# Patient Record
Sex: Female | Born: 1955
Health system: Southern US, Community
[De-identification: ages and names within clinical notes are randomized; demographics above are authoritative.]

## PROBLEM LIST (undated history)

## (undated) DIAGNOSIS — T7840XA Allergy, unspecified, initial encounter: Secondary | ICD-10-CM

## (undated) DIAGNOSIS — E785 Hyperlipidemia, unspecified: Secondary | ICD-10-CM

## (undated) DIAGNOSIS — D649 Anemia, unspecified: Secondary | ICD-10-CM

## (undated) DIAGNOSIS — Z8669 Personal history of other diseases of the nervous system and sense organs: Secondary | ICD-10-CM

## (undated) DIAGNOSIS — N6019 Diffuse cystic mastopathy of unspecified breast: Secondary | ICD-10-CM

## (undated) DIAGNOSIS — E079 Disorder of thyroid, unspecified: Secondary | ICD-10-CM

## (undated) HISTORY — DX: Personal history of other diseases of the nervous system and sense organs: Z86.69

## (undated) HISTORY — DX: Allergy, unspecified, initial encounter: T78.40XA

## (undated) HISTORY — PX: OTHER SURGICAL HISTORY: SHX169

## (undated) HISTORY — DX: Anemia, unspecified: D64.9

## (undated) HISTORY — DX: Hyperlipidemia, unspecified: E78.5

## (undated) HISTORY — DX: Diffuse cystic mastopathy of unspecified breast: N60.19

## (undated) HISTORY — DX: Disorder of thyroid, unspecified: E07.9

---
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
## 2000-08-04 ENCOUNTER — Other Ambulatory Visit: Admission: RE | Admit: 2000-08-04 | Discharge: 2000-08-04 | Payer: Self-pay | Admitting: *Deleted

## 2005-05-05 ENCOUNTER — Ambulatory Visit: Payer: Self-pay | Admitting: Unknown Physician Specialty

## 2005-05-17 ENCOUNTER — Ambulatory Visit: Payer: Self-pay | Admitting: Unknown Physician Specialty

## 2006-05-18 ENCOUNTER — Ambulatory Visit: Payer: Self-pay | Admitting: Unknown Physician Specialty

## 2006-07-26 ENCOUNTER — Ambulatory Visit: Payer: Self-pay | Admitting: Unknown Physician Specialty

## 2006-08-30 HISTORY — PX: HERNIA REPAIR: SHX51

## 2006-08-30 HISTORY — PX: FRONTALIS SUSPENSION: SHX1688

## 2007-05-23 ENCOUNTER — Ambulatory Visit: Payer: Self-pay | Admitting: Unknown Physician Specialty

## 2007-08-31 HISTORY — PX: BREAST CYST ASPIRATION: SHX578

## 2007-10-04 ENCOUNTER — Ambulatory Visit: Payer: Self-pay | Admitting: Unknown Physician Specialty

## 2008-05-23 ENCOUNTER — Ambulatory Visit: Payer: Self-pay | Admitting: Unknown Physician Specialty

## 2009-05-27 ENCOUNTER — Ambulatory Visit: Payer: Self-pay | Admitting: Unknown Physician Specialty

## 2010-06-19 ENCOUNTER — Ambulatory Visit: Payer: Self-pay | Admitting: Unknown Physician Specialty

## 2011-05-11 LAB — CBC AND DIFFERENTIAL
HCT: 41 % (ref 36–46)
Neutrophils Absolute: 2 /uL
Platelets: 190 10*3/uL (ref 150–399)

## 2011-05-11 LAB — TSH: TSH: 3.06 u[IU]/mL (ref <=5.90)

## 2011-05-11 LAB — LIPID PANEL: LDL Cholesterol: 68 mg/dL

## 2011-07-07 ENCOUNTER — Ambulatory Visit: Payer: Self-pay | Admitting: Unknown Physician Specialty

## 2011-09-02 ENCOUNTER — Encounter: Payer: Self-pay | Admitting: Internal Medicine

## 2011-09-02 ENCOUNTER — Ambulatory Visit (INDEPENDENT_AMBULATORY_CARE_PROVIDER_SITE_OTHER): Payer: PRIVATE HEALTH INSURANCE | Admitting: Internal Medicine

## 2011-09-02 DIAGNOSIS — E039 Hypothyroidism, unspecified: Secondary | ICD-10-CM

## 2011-09-02 DIAGNOSIS — E079 Disorder of thyroid, unspecified: Secondary | ICD-10-CM

## 2011-09-02 DIAGNOSIS — E559 Vitamin D deficiency, unspecified: Secondary | ICD-10-CM

## 2011-09-02 DIAGNOSIS — Z124 Encounter for screening for malignant neoplasm of cervix: Secondary | ICD-10-CM

## 2011-09-02 DIAGNOSIS — Z1211 Encounter for screening for malignant neoplasm of colon: Secondary | ICD-10-CM

## 2011-09-02 DIAGNOSIS — Z8669 Personal history of other diseases of the nervous system and sense organs: Secondary | ICD-10-CM | POA: Insufficient documentation

## 2011-09-02 DIAGNOSIS — Z79899 Other long term (current) drug therapy: Secondary | ICD-10-CM

## 2011-09-02 DIAGNOSIS — J45909 Unspecified asthma, uncomplicated: Secondary | ICD-10-CM

## 2011-09-02 DIAGNOSIS — G43909 Migraine, unspecified, not intractable, without status migrainosus: Secondary | ICD-10-CM

## 2011-09-02 DIAGNOSIS — J4540 Moderate persistent asthma, uncomplicated: Secondary | ICD-10-CM | POA: Insufficient documentation

## 2011-09-02 DIAGNOSIS — J309 Allergic rhinitis, unspecified: Secondary | ICD-10-CM

## 2011-09-02 MED ORDER — NEBIVOLOL HCL 2.5 MG PO TABS: 2.5000 mg | ORAL_TABLET | Freq: Every day | ORAL | Status: DC

## 2011-09-02 NOTE — Assessment & Plan Note (Addendum)
Triggered by  hormone fluctuations.  Occur every 2 weeks.  Started in late 30's.  Initially treated by headache clinic. Doesn't want to try depakot or topomax

## 2011-09-02 NOTE — Assessment & Plan Note (Signed)
Since age 56 but not treated regularly until the last 2 years .Marland Kitchen She is due for repeat TSH in March

## 2011-09-02 NOTE — Progress Notes (Signed)
  Subjective:    Patient ID: Caitlyn Gregory, female    DOB: July 01, 1956, 56 y.o.   MRN: [id number]  HPI  Ms, Caitlyn Gregory is a 56 yr old white femlae with a history of migraine headaches, seasonal rhinitis, and remote history of anorexia nervosa who is transferring care from [facility] .      Review of Systems     Objective:   Physical Exam        Assessment & Plan:   Subjective:    Caitlyn Gregory  Headache symptoms began about 30 years ago. Generally, the headaches last about 5 days and occur several times per month. The headaches do not seem to be related to any time of the day. The headaches are usually dull and are located in both hemispheres.  The patient rates her most severe headaches a 8 on a scale from 1 to 10. Recently, the headaches have been stable. Work attendance or other daily activities are affected by the headaches. Precipitating factors include: menses. The headaches are usually not preceded by an aura. Associated neurologic symptoms: worsening school/work performance and neck pain. The patient denies loss of balance and vision problems. Home has included excedrin and Imitrex oral with marked improvement. Other history includes: allergic rhinitis. Family history includes migraine headaches in mother.  The following portions of the patient's history were reviewed and updated as appropriate: allergies, current medications, past family history, past medical history, past social history, past surgical history and problem list.  Review of Systems A comprehensive review of systems was negative.    Objective:    BP 126/74  Pulse 68  Temp(Src) 97.7 F (36.5 C) (Oral)  Ht 5' 3.25" (1.607 m)  Wt 120 lb (54.432 kg)  BMI 21.09 kg/m2 General appearance: alert, cooperative and appears stated age Lungs: clear to auscultation bilaterally Heart: regular rate and rhythm, S1, S2 normal, no murmur, click, rub or gallop Abdomen: soft, non-tender; bowel sounds normal; no masses,  no  organomegaly Extremities: extremities normal, atraumatic, no cyanosis or edema Skin: Skin color, texture, turgor normal. No rashes or lesions    Assessment:    Menstrual migraine  with some component of medication rebound due to daily use of excedrin .   Plan:    Lie in darkened room and apply cold packs as needed for pain. Side effect profile discussed in detail. Asked to keep headache diary. Patient reassured that neurodiagnostic workup not indicated from benign H&P.  Trial of Bystolic  2.5 mg daily for prevention

## 2011-09-03 ENCOUNTER — Telehealth: Payer: Self-pay | Admitting: Internal Medicine

## 2011-09-03 NOTE — Telephone Encounter (Signed)
This is a new patient to me who was I believe  Was already  taking imitrex every month for years.  If it is suddenly too expensive, Iit means her insurance has decided to change coverage and she will need to look at her insurance formulary to see which one they cover at a lower Tier.

## 2011-09-03 NOTE — Telephone Encounter (Signed)
Medication for migraine prevention is to expensive could you prescribe something else.

## 2011-09-03 NOTE — Telephone Encounter (Signed)
Patient says that imitrex is too expensive, she is asking if she can get something cheaper.

## 2011-09-06 NOTE — Telephone Encounter (Signed)
Left message asking patient to return my call.

## 2011-09-07 ENCOUNTER — Telehealth: Payer: Self-pay | Admitting: *Deleted

## 2011-09-07 MED ORDER — METOPROLOL SUCCINATE ER 25 MG PO TB24: ORAL_TABLET | ORAL | Status: DC

## 2011-09-07 NOTE — Telephone Encounter (Signed)
Pt has been prescribed toprol for her headaches.

## 2011-09-07 NOTE — Telephone Encounter (Signed)
Pharmacist from [facility] [address] called to report that pt's co pay on bystolic is too expensive and they are asking that you change this to something else.  Please advise.

## 2011-09-07 NOTE — Telephone Encounter (Signed)
Ok,  Toprol XL 25 mg daily at bedtime for headache supression

## 2011-09-07 NOTE — Telephone Encounter (Signed)
Advised pharmacist, toprol called to [facility].

## 2011-09-23 ENCOUNTER — Telehealth: Payer: Self-pay | Admitting: Internal Medicine

## 2011-09-23 NOTE — Telephone Encounter (Signed)
Patient returned you call. 

## 2011-09-23 NOTE — Telephone Encounter (Signed)
Ms. Kinnison was a bit upset about the amount of time it took to change her medication the last time she called (see notes,  She  called on the morning of Friday the 4th, my response at 6 PM did not get taken care of until Tuesday the 8th. Please apologize to her for the delay.  Part of the problem was not enough information (ie, the name of the medication that her insurance would not cover was not referenced in the message so I assumed she was referring to Imitrex, not Bystolic).  Her husband told me today that the metoprolol is not working either because it is making her urinate all night long. Ask her if she is willing to try verapimil, a calcium channel blocker.  Call her on the cell phone  [phone].

## 2011-09-23 NOTE — Telephone Encounter (Signed)
Left message asking patient to return my call.

## 2011-09-27 NOTE — Telephone Encounter (Signed)
Spoke with patient, I did apologize for the phone call taking so long to be returned. Also I spoke with her about the verapimil and she says that she is going to think about it and will call back and let us know if she wants to take it. I will wait to here back from her.

## 2011-11-01 ENCOUNTER — Other Ambulatory Visit (INDEPENDENT_AMBULATORY_CARE_PROVIDER_SITE_OTHER): Payer: PRIVATE HEALTH INSURANCE | Admitting: *Deleted

## 2011-11-01 DIAGNOSIS — Z79899 Other long term (current) drug therapy: Secondary | ICD-10-CM

## 2011-11-01 DIAGNOSIS — E559 Vitamin D deficiency, unspecified: Secondary | ICD-10-CM

## 2011-11-01 DIAGNOSIS — E039 Hypothyroidism, unspecified: Secondary | ICD-10-CM

## 2011-11-01 LAB — COMPREHENSIVE METABOLIC PANEL
BUN: 16 mg/dL (ref 6–23)
CO2: 31 mEq/L (ref 19–32)
Calcium: 9.7 mg/dL (ref 8.4–10.5)
Chloride: 102 mEq/L (ref 96–112)
Creatinine, Ser: 0.7 mg/dL (ref 0.4–1.2)
GFR: 86.6 mL/min (ref >60.00)
Total Bilirubin: 0.3 mg/dL (ref 0.3–1.2)

## 2011-11-01 LAB — TSH: TSH: 1.8 u[IU]/mL (ref 0.35–5.50)

## 2011-11-02 LAB — VITAMIN D 25 HYDROXY (VIT D DEFICIENCY, FRACTURES): Vit D, 25-Hydroxy: 46 ng/mL (ref 30–89)

## 2011-11-03 ENCOUNTER — Encounter: Payer: Self-pay | Admitting: Internal Medicine

## 2011-12-11 ENCOUNTER — Other Ambulatory Visit: Payer: Self-pay | Admitting: Internal Medicine

## 2012-01-20 ENCOUNTER — Other Ambulatory Visit: Payer: Self-pay | Admitting: Internal Medicine

## 2012-01-21 MED ORDER — LEVOTHYROXINE SODIUM 75 MCG PO TABS: 75.0000 ug | ORAL_TABLET | Freq: Every day | ORAL | Status: DC

## 2012-01-21 MED ORDER — SUMATRIPTAN SUCCINATE 100 MG PO TABS: 100.0000 mg | ORAL_TABLET | ORAL | Status: DC | PRN

## 2012-01-25 ENCOUNTER — Other Ambulatory Visit: Payer: Self-pay | Admitting: Internal Medicine

## 2012-03-28 ENCOUNTER — Encounter: Payer: Self-pay | Admitting: Internal Medicine

## 2012-03-28 ENCOUNTER — Ambulatory Visit (INDEPENDENT_AMBULATORY_CARE_PROVIDER_SITE_OTHER): Payer: PRIVATE HEALTH INSURANCE | Admitting: Internal Medicine

## 2012-03-28 VITALS — BP 128/86 | HR 90 | Temp 98.4°F | Resp 16 | Wt 120.0 lb

## 2012-03-28 DIAGNOSIS — R350 Frequency of micturition: Secondary | ICD-10-CM

## 2012-03-28 DIAGNOSIS — G43909 Migraine, unspecified, not intractable, without status migrainosus: Secondary | ICD-10-CM

## 2012-03-28 DIAGNOSIS — R232 Flushing: Secondary | ICD-10-CM

## 2012-03-28 DIAGNOSIS — R002 Palpitations: Secondary | ICD-10-CM

## 2012-03-28 DIAGNOSIS — N951 Menopausal and female climacteric states: Secondary | ICD-10-CM

## 2012-03-28 LAB — POCT URINALYSIS DIPSTICK
Glucose, UA: NEGATIVE
Nitrite, UA: NEGATIVE
Urobilinogen, UA: 0.2

## 2012-03-28 LAB — COMPREHENSIVE METABOLIC PANEL
AST: 23 U/L (ref 0–37)
Alkaline Phosphatase: 51 U/L (ref 39–117)
BUN: 20 mg/dL (ref 6–23)
Calcium: 9.5 mg/dL (ref 8.4–10.5)
Creatinine, Ser: 0.7 mg/dL (ref 0.4–1.2)
Total Bilirubin: 0.7 mg/dL (ref 0.3–1.2)

## 2012-03-28 LAB — TSH: TSH: 4.05 u[IU]/mL (ref 0.35–5.50)

## 2012-03-28 LAB — HEMOGLOBIN A1C: Hgb A1c MFr Bld: 5.2 % (ref 4.6–6.5)

## 2012-03-28 NOTE — Progress Notes (Addendum)
Patient ID: Caitlyn Gregory, female   DOB: 28-Feb-1956, 56 y.o.   MRN: [id number]   Patient Active Problem List  Diagnosis  . Screening for cervical cancer  . Migraine headache  . Hypothyroidism  . Rhinitis, allergic  . Asthma  . Hypovitaminosis D  . Screening for colon cancer  . History of migraine headaches  . Thyroid disease  . Menopausal syndrome    Subjective:  CC:   Chief Complaint  Patient presents with  . Headache    everyday in July  . Hypertension    HPI:   Caitlyn Gregory a 56 y.o. female who presents  Daily headache  Occuring for the last 3 to 4 weeks.,  bp has been elevated  To 144/86,  But has noted improvement in readings with  treatment of migraines with imitrex.  Has cut out caffeine.  exercises daily, but headaches are not brought on with exercise.Marland Kitchen  Anxiety and jittery prominent features .  Having hot flashes now on a daily basis. Had onset of night sweats a few years ago but none until very recently.    Past Medical History  Diagnosis Date  . History of migraine headaches   . Thyroid disease   . Asthma   . Allergy   . Fibrocystic breast disease   . Anemia   . Vitamin d deficiency   . Hyperlipidemia     Past Surgical History  Procedure Date  . Chin implant [id number]  . Appendectomy 1967    secondary to rupture  . Frontalis suspension 2008  . Hernia repair 2008         The following portions of the patient's history were reviewed and updated as appropriate: Allergies, current medications, and problem list.    Review of Systems:   12 Pt  review of systems was negative except those addressed in the HPI,     History   Social History  . Marital Status: Married    Spouse Name: N/A    Number of Children: N/A  . Years of Education: N/A   Occupational History  . Not on file.   Social History Main Topics  . Smoking status: Never Smoker   . Smokeless tobacco: Never Used  . Alcohol Use: No  . Drug Use: No  . Sexually Active: Not on file    Other Topics Concern  . Not on file   Social History Narrative  . No narrative on file    Objective:  BP 128/86  Pulse 90  Temp 98.4 F (36.9 C) (Oral)  Resp 16  Wt 120 lb (54.432 kg)  SpO2 97%  General appearance: alert, anxious, cooperative and appears stated age Ears: normal TM's and external ear canals both ears Neck: no adenopathy, no carotid bruit, supple, symmetrical, trachea midline and thyroid not enlarged, symmetric, no tenderness/mass/nodules Back: symmetric, no curvature. ROM normal. No CVA tenderness. Lungs: clear to auscultation bilaterally Heart: regular rate and rhythm, S1, S2 normal, no murmur, click, rub or gallop Abdomen: soft, non-tender; bowel sounds normal; no masses,  no organomegaly Pulses: 2+ and symmetric Skin: Skin color, texture, turgor normal. No rashes or lesions Lymph nodes: Cervical, supraclavicular, and axillary nodes normal.  Assessment and Plan:  Menopausal syndrome Suggested by history.  Checking thyroid, FSH and LH.  Discussed use of SSRI to manage mood disorder issues.  She will consider it. Does not want hormone therapy .  Migraine headache Discussed  reducing her aerobic exercise regimen as she may be exercising too  vigorously /frequently , and starting a prophylactic medication to manage daily headaches and avoid use of triptans daily.  We tried bystolic in the past, followed by metoprolol, which was not tolerated due to excessive fatigue. Will consider cardizem if headaches continue after reducing her exercise schedule.    Updated Medication List Outpatient Encounter Prescriptions as of 03/28/2012  Medication Sig Dispense Refill  . Aspirin-Acetaminophen-Caffeine (EXCEDRIN PO) Take by mouth daily as needed       . Cholecalciferol (VITAMIN D) 2000 UNITS CAPS Take one by mouth daily       . levothyroxine (SYNTHROID, LEVOTHROID) 75 MCG tablet Take 1 tablet (75 mcg total) by mouth daily.  30 tablet  6  . SUMAtriptan (IMITREX) 100 MG  tablet Take 1 tablet (100 mg total) by mouth every 2 (two) hours as needed for migraine. Take one half at onset of headache  10 tablet  6  . DISCONTD: calcipotriene-betamethasone (TACLONEX) ointment Apply topically daily.        Marland Kitchen DISCONTD: FLOVENT HFA 44 MCG/ACT inhaler INHALE 2 PUFFS BY MOUTH AS DIRECTED 1-2 TIMES A DAY  10.6 g  1  . DISCONTD: metoprolol succinate (TOPROL-XL) 25 MG 24 hr tablet Take one at bedtime for headache suppression.  30 tablet  0  . DISCONTD: nebivolol (BYSTOLIC) 2.5 MG tablet Take 1 tablet (2.5 mg total) by mouth daily.  30 tablet  3     Orders Placed This Encounter  Procedures  . Urine Culture  . HM MAMMOGRAPHY  . TSH  . Magnesium  . Comprehensive metabolic panel  . Follicle stimulating hormone  . Luteinizing hormone  . Hemoglobin A1c  . HM PAP SMEAR  . POCT Urinalysis Dipstick  . HM COLONOSCOPY    No Follow-up on file.

## 2012-03-28 NOTE — Patient Instructions (Addendum)
I recommend you reduce your aerobic exercise to 3 days  30 minutes per week.  You can continue your yoga if it helps you relax.   Think about starting an SSRI to help manage your hot flashe and tension (Lexapro  5mg  )  I am rechecking  TSH ,  FH and LH  hgba1c  and urine today

## 2012-03-29 ENCOUNTER — Encounter: Payer: Self-pay | Admitting: Internal Medicine

## 2012-03-29 DIAGNOSIS — N951 Menopausal and female climacteric states: Secondary | ICD-10-CM | POA: Insufficient documentation

## 2012-03-29 NOTE — Assessment & Plan Note (Signed)
Discussed  reducing her aerobic exercise regimen as she may be exercising too vigorously /frequently , and starting a prophylactic medication to manage daily headaches and avoid use of triptans daily.  We tried bystolic in the past, followed by metoprolol, which was not tolerated due to excessive fatigue. Will consider cardizem if headaches continue after reducing her exercise schedule.

## 2012-03-29 NOTE — Assessment & Plan Note (Signed)
Suggested by history.  Checking thyroid, FSH and LH.  Discussed use of SSRI to manage mood disorder issues.  She will consider it. Does not want hormone therapy .

## 2012-03-30 LAB — LUTEINIZING HORMONE: LH: 33.38 m[IU]/mL

## 2012-03-30 LAB — URINE CULTURE: Organism ID, Bacteria: NO GROWTH

## 2012-04-12 ENCOUNTER — Telehealth: Payer: Self-pay | Admitting: Internal Medicine

## 2012-04-12 NOTE — Telephone Encounter (Signed)
Patient called and stated she received your MyChart message and noticed her TSH was 4.05.  She wanted to know if she medication needed to be changed because her previous doctor told her if it got above 4 then it would need to be adjusted.  Please advise.

## 2012-04-12 NOTE — Telephone Encounter (Signed)
I intentionally did not increase her thyroid medication bc 4.05 is still normal,  And increasing her dose could increase her jitteriness and palpitations. If she would like to tyr a higher dose knowing that, I will call in a higher strength and we will need to repeat her TSH in 6 weeks.  Let me know

## 2012-04-12 NOTE — Telephone Encounter (Signed)
Patient notified, she stated she will stay on the same dose for now and at her appt in October she wants to have it checked again.

## 2012-04-12 NOTE — Telephone Encounter (Signed)
Left message asking patient to call back

## 2012-05-31 ENCOUNTER — Encounter: Payer: Self-pay | Admitting: Internal Medicine

## 2012-05-31 ENCOUNTER — Ambulatory Visit (INDEPENDENT_AMBULATORY_CARE_PROVIDER_SITE_OTHER): Payer: PRIVATE HEALTH INSURANCE | Admitting: Internal Medicine

## 2012-05-31 ENCOUNTER — Other Ambulatory Visit (HOSPITAL_COMMUNITY)
Admission: RE | Admit: 2012-05-31 | Discharge: 2012-05-31 | Disposition: A | Discharge status: Home / Self Care | Payer: PRIVATE HEALTH INSURANCE | Source: Ambulatory Visit | Attending: Internal Medicine | Admitting: Internal Medicine

## 2012-05-31 VITALS — BP 120/76 | HR 82 | Temp 98.6°F | Ht 63.5 in | Wt 120.5 lb

## 2012-05-31 DIAGNOSIS — Z0000 Encounter for general adult medical examination without abnormal findings: Secondary | ICD-10-CM

## 2012-05-31 DIAGNOSIS — G43909 Migraine, unspecified, not intractable, without status migrainosus: Secondary | ICD-10-CM

## 2012-05-31 DIAGNOSIS — Z01411 Encounter for gynecological examination (general) (routine) with abnormal findings: Secondary | ICD-10-CM

## 2012-05-31 DIAGNOSIS — Z01419 Encounter for gynecological examination (general) (routine) without abnormal findings: Secondary | ICD-10-CM | POA: Insufficient documentation

## 2012-05-31 DIAGNOSIS — E039 Hypothyroidism, unspecified: Secondary | ICD-10-CM

## 2012-05-31 DIAGNOSIS — Z1151 Encounter for screening for human papillomavirus (HPV): Secondary | ICD-10-CM | POA: Insufficient documentation

## 2012-05-31 DIAGNOSIS — R9389 Abnormal findings on diagnostic imaging of other specified body structures: Secondary | ICD-10-CM

## 2012-05-31 DIAGNOSIS — Z124 Encounter for screening for malignant neoplasm of cervix: Secondary | ICD-10-CM

## 2012-05-31 DIAGNOSIS — Z23 Encounter for immunization: Secondary | ICD-10-CM

## 2012-05-31 DIAGNOSIS — E559 Vitamin D deficiency, unspecified: Secondary | ICD-10-CM

## 2012-05-31 MED ORDER — LEVOTHYROXINE SODIUM 88 MCG PO TABS: 88.0000 ug | ORAL_TABLET | Freq: Every day | ORAL | Status: DC

## 2012-05-31 NOTE — Assessment & Plan Note (Signed)
Repeat level ordered; she is currently taking 2000 units daily.

## 2012-05-31 NOTE — Assessment & Plan Note (Signed)
last TSH was 4.05, and she is attributing her symptoms of fatigue, joint pain and headaches to underactive thyroid.  We will increase Synthroid dose to 88 mcg and repeat TSH in 6 weeks.

## 2012-05-31 NOTE — Progress Notes (Signed)
Patient ID: Caitlyn Gregory, female   DOB: 03/15/1956, 56 y.o.   MRN: [id number]   Subjective:    Caitlyn Gregory is a 56 y.o. female and is here for a comprehensive physical exam. The patient reports increased anxiety, improved headaches, joint pain involving 2 hands on right hand and left great toe, all sites of previous trauma. .   Health Maintenance  Topic Date Due  . Tetanus/tdap  11/02/1975  . Influenza Vaccine  04/30/2012  . Mammogram  07/06/2013  . Pap Smear  05/29/2014  . Colonoscopy  03/29/2017    The following portions of the patient's history were reviewed and updated as appropriate: allergies, current medications, past family history, past medical history, past social history, past surgical history and problem list.  Review of Systems A comprehensive review of systems was negative except for: Musculoskeletal: positive for stiff joints   Objective:    BP 120/76  Pulse 82  Temp 98.6 F (37 C) (Oral)  Ht 5' 3.5" (1.613 m)  Wt 120 lb 8 oz (54.658 kg)  BMI 21.01 kg/m2  SpO2 98% General appearance: alert, cooperative and appears stated age Eyes: conjunctivae/corneas clear. PERRL, EOM's intact. Fundi benign. Throat: lips, mucosa, and tongue normal; teeth and gums normal Neck: no adenopathy, no carotid bruit, no JVD, supple, symmetrical, trachea midline and thyroid not enlarged, symmetric, no tenderness/mass/nodules Back: symmetric, no curvature. ROM normal. No CVA tenderness. Lungs: clear to auscultation bilaterally Breasts: normal appearance, no masses or tenderness Heart: regular rate and rhythm, S1, S2 normal, no murmur, click, rub or gallop Abdomen: soft, non-tender; bowel sounds normal; no masses,  no organomegaly Pelvic: cervix normal in appearance, external genitalia normal, no adnexal masses or tenderness, no cervical motion tenderness, positive findings: friable cervix, uterus normal size, shape, and consistency and vagina normal without discharge Extremities: extremities normal,  atraumatic, no cyanosis or edema Pulses: 2+ and symmetric Skin: Skin color, texture, turgor normal. No rashes or lesions Lymph nodes: Cervical, supraclavicular, and axillary nodes normal. Neurologic: Grossly normal    Assessment:   Hypothyroidism last TSH was 4.05, and she is attributing her symptoms of fatigue, joint pain and headaches to underactive thyroid.  We will increase Synthroid dose to 88 mcg and repeat TSH in 6 weeks.   Migraine headache Improved since stopping her upper body weight lifting.   Screening for cervical cancer PAP was done today.  Cervix was friable.  Last intercourse was 4 days ago.  Will repeat pelvic exam in a month or two after she has postponed coitus for a week prior.    Hypovitaminosis D Repeat level ordered; she is currently taking 2000 units daily.   Updated Medication List Outpatient Encounter Prescriptions as of 05/31/2012  Medication Sig Dispense Refill  . Aspirin-Acetaminophen-Caffeine (EXCEDRIN PO) Take by mouth daily as needed       . Cholecalciferol (VITAMIN D) 2000 UNITS CAPS Take one by mouth daily       . fluticasone (FLOVENT HFA) 44 MCG/ACT inhaler Inhale 1 puff into the lungs 2 (two) times daily.      Marland Kitchen levothyroxine (SYNTHROID, LEVOTHROID) 88 MCG tablet Take 1 tablet (88 mcg total) by mouth daily.  30 tablet  6  . SUMAtriptan (IMITREX) 100 MG tablet Take 1 tablet (100 mg total) by mouth every 2 (two) hours as needed for migraine. Take one half at onset of headache  10 tablet  6  . DISCONTD: levothyroxine (SYNTHROID, LEVOTHROID) 75 MCG tablet Take 1 tablet (75 mcg total) by mouth  daily.  30 tablet  6

## 2012-05-31 NOTE — Patient Instructions (Addendum)
Please return around Thanksgiving for a reapet TSH (after 6 weeks of increased Synthroid dose)  Continue 2000 units of Vit D for  now

## 2012-05-31 NOTE — Assessment & Plan Note (Signed)
PAP was done today.  Cervix was friable.  Last intercourse was 4 days ago.  Will repeat pelvic exam in a month or two after she has postponed coitus for a week prior.

## 2012-05-31 NOTE — Assessment & Plan Note (Signed)
Improved since stopping her upper body weight lifting.

## 2012-06-04 LAB — GENITAL CULTURE

## 2012-06-09 LAB — HM PAP SMEAR: HM Pap smear: NORMAL

## 2012-06-12 ENCOUNTER — Encounter: Payer: Self-pay | Admitting: Internal Medicine

## 2012-06-12 DIAGNOSIS — E039 Hypothyroidism, unspecified: Secondary | ICD-10-CM

## 2012-06-13 MED ORDER — LEVOTHYROXINE SODIUM 75 MCG PO TABS: 75.0000 ug | ORAL_TABLET | Freq: Every day | ORAL | Status: DC

## 2012-07-14 ENCOUNTER — Other Ambulatory Visit: Payer: PRIVATE HEALTH INSURANCE

## 2012-08-07 ENCOUNTER — Encounter: Payer: Self-pay | Admitting: Internal Medicine

## 2012-08-07 DIAGNOSIS — E039 Hypothyroidism, unspecified: Secondary | ICD-10-CM

## 2012-08-07 DIAGNOSIS — Z1239 Encounter for other screening for malignant neoplasm of breast: Secondary | ICD-10-CM

## 2012-08-09 MED ORDER — LEVOTHYROXINE SODIUM 75 MCG PO TABS: 75.0000 ug | ORAL_TABLET | Freq: Every day | ORAL | Status: DC

## 2012-08-15 ENCOUNTER — Other Ambulatory Visit: Payer: Self-pay | Admitting: Internal Medicine

## 2012-08-15 NOTE — Telephone Encounter (Signed)
Refill request for Imitrex 100 mg. Ok to refill?

## 2012-08-15 NOTE — Telephone Encounter (Signed)
Drug Name- Sumatriptan Succ 100 mg tab  Directions- Take 1/2 tablet at onset of headache then take 1 tablet every 2 hours as needed for migrane  Quantity-10

## 2012-08-16 MED ORDER — SUMATRIPTAN SUCCINATE 100 MG PO TABS: 100.0000 mg | ORAL_TABLET | ORAL | Status: DC | PRN

## 2012-09-07 ENCOUNTER — Ambulatory Visit: Payer: Self-pay | Admitting: Internal Medicine

## 2012-09-08 ENCOUNTER — Telehealth: Payer: Self-pay | Admitting: Internal Medicine

## 2012-09-08 NOTE — Telephone Encounter (Signed)
Her mammogram was normal.  Repeat in one year 

## 2012-09-11 NOTE — Telephone Encounter (Signed)
Pt notified of the results.  

## 2012-09-20 ENCOUNTER — Other Ambulatory Visit: Payer: Self-pay | Admitting: Internal Medicine

## 2012-09-20 ENCOUNTER — Other Ambulatory Visit: Payer: Self-pay | Admitting: General Practice

## 2012-09-20 ENCOUNTER — Encounter: Payer: Self-pay | Admitting: Internal Medicine

## 2012-09-20 DIAGNOSIS — E039 Hypothyroidism, unspecified: Secondary | ICD-10-CM

## 2012-09-20 NOTE — Telephone Encounter (Signed)
Med filled.  

## 2013-01-05 ENCOUNTER — Ambulatory Visit (INDEPENDENT_AMBULATORY_CARE_PROVIDER_SITE_OTHER): Payer: PRIVATE HEALTH INSURANCE | Admitting: Internal Medicine

## 2013-01-05 ENCOUNTER — Encounter: Payer: Self-pay | Admitting: Internal Medicine

## 2013-01-05 VITALS — BP 138/88 | HR 80 | Temp 98.0°F | Resp 16 | Wt 120.5 lb

## 2013-01-05 DIAGNOSIS — F4323 Adjustment disorder with mixed anxiety and depressed mood: Secondary | ICD-10-CM

## 2013-01-05 DIAGNOSIS — IMO0001 Reserved for inherently not codable concepts without codable children: Secondary | ICD-10-CM

## 2013-01-05 DIAGNOSIS — J4540 Moderate persistent asthma, uncomplicated: Secondary | ICD-10-CM

## 2013-01-05 DIAGNOSIS — N952 Postmenopausal atrophic vaginitis: Secondary | ICD-10-CM

## 2013-01-05 DIAGNOSIS — Z124 Encounter for screening for malignant neoplasm of cervix: Secondary | ICD-10-CM

## 2013-01-05 DIAGNOSIS — E559 Vitamin D deficiency, unspecified: Secondary | ICD-10-CM

## 2013-01-05 DIAGNOSIS — E039 Hypothyroidism, unspecified: Secondary | ICD-10-CM

## 2013-01-05 DIAGNOSIS — J45909 Unspecified asthma, uncomplicated: Secondary | ICD-10-CM

## 2013-01-05 DIAGNOSIS — N951 Menopausal and female climacteric states: Secondary | ICD-10-CM

## 2013-01-05 LAB — CBC WITH DIFFERENTIAL/PLATELET
Basophils Absolute: 0 10*3/uL (ref 0.0–0.1)
Eosinophils Relative: 3 % (ref 0–5)
HCT: 41.2 % (ref 36.0–46.0)
Lymphocytes Relative: 29 % (ref 12–46)
Lymphs Abs: 1.4 10*3/uL (ref 0.7–4.0)
MCV: 97.6 fL (ref 78.0–100.0)
Monocytes Absolute: 0.5 10*3/uL (ref 0.1–1.0)
Neutro Abs: 2.8 10*3/uL (ref 1.7–7.7)
Platelets: 233 10*3/uL (ref 150–400)
RBC: 4.22 MIL/uL (ref 3.87–5.11)
WBC: 4.8 10*3/uL (ref 4.0–10.5)

## 2013-01-05 NOTE — Progress Notes (Signed)
Patient ID: Caitlyn Gregory, female   DOB: 09-02-1955, 57 y.o.   MRN: [id number]   Patient Active Problem List   Diagnosis Date Noted  . Postmenopausal atrophic vaginitis 01/07/2013  . Menopausal syndrome 03/29/2012  . Screening for cervical cancer 09/02/2011  . Migraine headache 09/02/2011  . Hypothyroidism 09/02/2011  . Rhinitis, allergic 09/02/2011  . Asthma, moderate persistent, well-controlled 09/02/2011  . Hypovitaminosis D 09/02/2011  . Screening for colon cancer 09/02/2011    Subjective:  CC:   Chief Complaint  Patient presents with  . Acute Visit    hormonal issues    HPI:   Caitlyn Gregory a 57 y.o. 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  Marland Kitchen   3) Joint pain:  She feels more stiff and achey every day despite lowering and finally eliminating use of free weights and continuing yoga and exercise.  No one particular joint.  No erythema or warmth.  Out performs the other members in her yoga class despite her age  12) dysmennorrhea:  Cannot drink alcohol bc of migraines,  "I use sex to make me feel young since I'm such a good girl about everything else." but has been having difficulty with vaginal dryness leading to repeated testing for infection. .   Past Medical History  Diagnosis Date  . History of migraine headaches   . Thyroid disease   . Asthma   . Allergy   . Fibrocystic  breast disease   . Anemia   . Vitamin D deficiency   . Hyperlipidemia     Past Surgical History  Procedure Laterality Date  . Chin implant  [id number]  . Appendectomy  1967    secondary to rupture  . Frontalis suspension  2008  . Hernia repair  2008       The following portions of the patient's history were reviewed and updated as appropriate: Allergies, current medications, and problem list.    Review of Systems:   Patient denies headache, fevers, malaise, unintentional weight loss, skin rash, eye pain, sinus congestion and sinus pain, sore throat, dysphagia,  hemoptysis , cough, dyspnea, wheezing, chest pain, palpitations, orthopnea, edema, abdominal pain, nausea, melena, diarrhea, constipation, flank pain, dysuria, hematuria, urinary  Frequency, nocturia, numbness, tingling, seizures,  Focal weakness, Loss of consciousness,  Tremor, insomnia, depression,  and suicidal ideation.     History   Social History  . Marital Status: Married    Spouse Name: N/A    Number of Children: N/A  . Years of Education: N/A   Occupational History  . Not on file.   Social History Main Topics  . Smoking status: Never Smoker   . Smokeless tobacco: Never Used  . Alcohol Use: No  . Drug Use: No  . Sexually Active: Not on file   Other Topics Concern  . Not on file  Social History Narrative  . No narrative on file    Objective:  BP 138/88  Pulse 80  Temp(Src) 98 F (36.7 C) (Oral)  Resp 16  Wt 120 lb 8 oz (54.658 kg)  BMI 21.01 kg/m2  SpO2 98%  General appearance: alert, cooperative and appears stated age Ears: normal TM's and external ear canals both ears Throat: lips, mucosa, and tongue normal; teeth and gums normal Neck: no adenopathy, no carotid bruit, supple, symmetrical, trachea midline and thyroid not enlarged, symmetric, no tenderness/mass/nodules Back: symmetric, no curvature. ROM normal. No CVA tenderness. Lungs: clear to auscultation bilaterally Heart: regular  rate and rhythm, S1, S2 normal, no murmur, click, rub or gallop Abdomen: soft, non-tender; bowel sounds normal; no masses,  no organomegaly Pulses: 2+ and symmetric Skin: Skin color, texture, turgor normal. No rashes or lesions Lymph nodes: Cervical, supraclavicular, and axillary nodes normal.  Assessment and Plan:  Postmenopausal atrophic vaginitis Pelvic exam today revealed bilateral erosions at the introitus consistent with friction burns secondary to atrophic vaginitis.  Had long discussion about vaginal lubricants vs vaginal estrogen.  Patient wants to try nonhormonal treatments first.    Asthma, moderate persistent, well-controlled Discussed with patient the recent article she brought about using long term azithromycin to clear patients of mycoplasma rumored to be the cause of many asthma symptoms .  Referral to pulmonologist Mcquaid as she has not seen a pulmonologist in years.    Hypothyroidism TSH is at goal.  No changes to drug regimen.   Screening for cervical cancer Normal PAP and HPV screen Oct 2013,  Vaginal cultures normal  Hypovitaminosis D Well controlled on current regimen.  no changes today.  Menopausal syndrome 60 minutes spent with patient in total today discussing her dysphoria.  Recommended trial of effexor.  She will think about it.    Updated Medication List Outpatient Encounter Prescriptions as of 01/05/2013  Medication Sig Dispense Refill  . Aspirin-Acetaminophen-Caffeine (EXCEDRIN PO) Take by mouth daily as needed       . Cholecalciferol (VITAMIN D) 2000 UNITS CAPS Take one by mouth daily       . SUMAtriptan (IMITREX) 100 MG tablet Take 1 tablet (100 mg total) by mouth every 2 (two) hours as needed for migraine. Take one half at onset of headache  10 tablet  6  . SYNTHROID 75 MCG tablet TAKE 1 TABLET (75 MCG TOTAL) BY MOUTH DAILY.  30 tablet  6  . fluticasone (FLOVENT HFA) 44 MCG/ACT inhaler Inhale 1 puff into the lungs 2 (two) times daily.       No  facility-administered encounter medications on file as of 01/05/2013.     Orders Placed This Encounter  Procedures  . CBC with Differential  . TSH  . Vitamin D 25 hydroxy  . CK  . Ambulatory referral to Pulmonology    No Follow-up on file.

## 2013-01-05 NOTE — Patient Instructions (Addendum)
We are repeating your thyroid and vitamin D levels today.  If they are normal,  I recommend that you consider resuming effexor for a month trial.  Your vaginal exam is consistent with "friction burns" due to the effects of atrophic vaginitis on your ability to maintain lubrication  OTC lubricants worth tryng include Astroglide and KY personal lubricant (vaginal suppository)  We can always try vaginal estrogen  Referral to Dr Kendrick Fries for your asthma

## 2013-01-06 ENCOUNTER — Encounter: Payer: Self-pay | Admitting: Internal Medicine

## 2013-01-06 LAB — CK: Total CK: 77 U/L (ref 7–177)

## 2013-01-07 DIAGNOSIS — N952 Postmenopausal atrophic vaginitis: Secondary | ICD-10-CM | POA: Insufficient documentation

## 2013-01-07 NOTE — Assessment & Plan Note (Signed)
60 minutes spent with patient in total today discussing her dysphoria.  Recommended trial of effexor.  She will think about it.

## 2013-01-07 NOTE — Assessment & Plan Note (Signed)
Discussed with patient the recent article she brought about using long term azithromycin to clear patients of mycoplasma rumored to be the cause of many asthma symptoms .  Referral to pulmonologist Mcquaid as she has not seen a pulmonologist in years.

## 2013-01-07 NOTE — Assessment & Plan Note (Signed)
Pelvic exam today revealed bilateral erosions at the introitus consistent with friction burns secondary to atrophic vaginitis.  Had long discussion about vaginal lubricants vs vaginal estrogen.  Patient wants to try nonhormonal treatments first.

## 2013-01-07 NOTE — Assessment & Plan Note (Signed)
Normal PAP and HPV screen Oct 2013,  Vaginal cultures normal

## 2013-01-07 NOTE — Assessment & Plan Note (Signed)
TSH is at goal.  No changes to drug regimen.

## 2013-01-07 NOTE — Assessment & Plan Note (Signed)
Well controlled on current regimen.  no changes today.   

## 2013-01-08 ENCOUNTER — Encounter: Payer: Self-pay | Admitting: Emergency Medicine

## 2013-01-23 ENCOUNTER — Encounter: Payer: Self-pay | Admitting: Pulmonary Disease

## 2013-01-23 ENCOUNTER — Ambulatory Visit (INDEPENDENT_AMBULATORY_CARE_PROVIDER_SITE_OTHER)
Admission: RE | Admit: 2013-01-23 | Discharge: 2013-01-23 | Disposition: A | Discharge status: Home / Self Care | Payer: PRIVATE HEALTH INSURANCE | Source: Ambulatory Visit | Attending: Pulmonary Disease | Admitting: Pulmonary Disease

## 2013-01-23 ENCOUNTER — Ambulatory Visit (INDEPENDENT_AMBULATORY_CARE_PROVIDER_SITE_OTHER): Payer: PRIVATE HEALTH INSURANCE | Admitting: Pulmonary Disease

## 2013-01-23 VITALS — BP 122/80 | HR 84 | Temp 98.2°F | Ht 63.0 in | Wt 119.1 lb

## 2013-01-23 DIAGNOSIS — J45909 Unspecified asthma, uncomplicated: Secondary | ICD-10-CM

## 2013-01-23 DIAGNOSIS — R059 Cough, unspecified: Secondary | ICD-10-CM

## 2013-01-23 DIAGNOSIS — J4540 Moderate persistent asthma, uncomplicated: Secondary | ICD-10-CM

## 2013-01-23 DIAGNOSIS — R05 Cough: Secondary | ICD-10-CM | POA: Insufficient documentation

## 2013-01-23 NOTE — Assessment & Plan Note (Signed)
I question this diagnosis. She has never had benefit from inhaled corticosteroids and she does not have intermittent wheezing or shortness of breath which is consistent with asthma. She has no airflow obstruction on pulmonary function testing today so I think we can order a methacholine challenge test to put this issue to rest.  See discussion under cough below.

## 2013-01-23 NOTE — Patient Instructions (Signed)
Start using Nasacort one puff each nostril daily Use chlortrimeton/phenylephrine as needed for sinus congestion Use saline sprays or rinses at least twice a day (at least 30 minutes after Nasacort)  We will send you for a Chest Xray We will send you for a methacholine challenge test at [facility]  We will see you back in one month or sooner if needed

## 2013-01-23 NOTE — Progress Notes (Signed)
Subjective:    Patient ID: Caitlyn Gregory, female    DOB: 1956-01-04, 57 y.o.   MRN: [id number]  HPI  A medial is a 57 year old female who comes to our clinic today for evaluation of ongoing chest congestion and cough for 32 years. She states that she had no respiratory symptoms as a child at age 7 she had "a nasty respiratory infection" and she has had chronic cough and congestion ever since then. Apparently not long after this respiratory infection she was evaluated at [facility] where she underwent a bronchoscopy and was treated with 30 days of a sulfa-containing antibiotic as well as theophylline. She states this made no difference in her symptoms and she was never given a definitive diagnosis. Ever since then she has had daily chest congestion, cough productive of clear to green mucus particularly in the mornings. She also has postnasal drip and sinus congestion. In the past she has taken multiple therapies for the chest congestion most recently Flovent. She states that Flovent gives her a headache so she stopped using it in December. She thinks that maybe her chest congestion has picked up since stopping the medication but not by much. She also states that she has used nasal steroids in the past and these also gave her a headache so she stopped. She continues to have daily sinus congestion which is exacerbated by being around grass, pollen, smokers, and chemicals. Typically after several days of sinus congestion her cough worsen. Sometimes when the weather worsens her cough gets worse. Specifically she says that eventually when the wart and this will lead to congestion. She also states that there is mildew in her house but she does not have resolution of symptoms when she goes away on vacation.  She has experienced several bouts of bronchitis over the years but no more frequently than every 2 years or so. She has never smoked cigarettes.    Past Medical History  Diagnosis Date  .  History of migraine headaches   . Thyroid disease   . Asthma   . Allergy   . Fibrocystic breast disease   . Anemia   . Vitamin D deficiency   . Hyperlipidemia      Family History  Problem Relation Age of Onset  . Stroke Mother 62  . Cancer Mother     unknown tyoe  . Osteoporosis Mother   . Heart disease Father 100    AMI, died at 108 from second MI  . Alcohol abuse Brother      History   Social History  . Marital Status: Married    Spouse Name: N/A    Number of Children: N/A  . Years of Education: N/A   Occupational History  . Not on file.   Social History Main Topics  . Smoking status: Never Smoker   . Smokeless tobacco: Never Used  . Alcohol Use: No  . Drug Use: No  . Sexually Active: Not on file   Other Topics Concern  . Not on file   Social History Narrative  . No narrative on file     Allergies  Allergen Reactions  . Penicillins Rash  . Sulfa Antibiotics Rash     Outpatient Prescriptions Prior to Visit  Medication Sig Dispense Refill  . Aspirin-Acetaminophen-Caffeine (EXCEDRIN PO) Take by mouth daily as needed       . Cholecalciferol (VITAMIN D) 2000 UNITS CAPS Take one by mouth daily       . SUMAtriptan (IMITREX)  100 MG tablet Take 1 tablet (100 mg total) by mouth every 2 (two) hours as needed for migraine. Take one half at onset of headache  10 tablet  6  . SYNTHROID 75 MCG tablet TAKE 1 TABLET (75 MCG TOTAL) BY MOUTH DAILY.  30 tablet  6  . fluticasone (FLOVENT HFA) 44 MCG/ACT inhaler Inhale 1 puff into the lungs 2 (two) times daily.       No facility-administered medications prior to visit.      Review of Systems  Constitutional: Negative for fever, chills and unexpected weight change.  HENT: Positive for congestion. Negative for ear pain, nosebleeds, sore throat, rhinorrhea, sneezing, trouble swallowing, dental problem, voice change, postnasal drip and sinus pressure.   Eyes: Negative for visual disturbance.  Respiratory: Positive for cough  and shortness of breath. Negative for choking.   Cardiovascular: Negative for chest pain and leg swelling.  Gastrointestinal: Negative for vomiting, abdominal pain and diarrhea.  Genitourinary: Negative for difficulty urinating.  Musculoskeletal: Positive for arthralgias.  Skin: Negative for rash.  Neurological: Positive for headaches. Negative for tremors and syncope.  Hematological: Does not bruise/bleed easily.       Objective:   Physical Exam  Filed Vitals:   01/23/13 1117  BP: 122/80  Pulse: 84  Temp: 98.2 F (36.8 C)  TempSrc: Oral  Height: 5\' 3"  (1.6 m)  Weight: 119 lb 1.9 oz (54.032 kg)  SpO2: 97%   RA  Gen: well appearing, no acute distress HEENT: NCAT, PERRL, EOMi, OP clear, neck supple without masses PULM: CTA B CV: RRR, no mgr, no JVD AB: BS+, soft, nontender, no hsm Ext: warm, no edema, no clubbing, no cyanosis Derm: no rash or skin breakdown Neuro: A&Ox4, CN II-XII intact, strength 5/5 in all 4 extremities  Jan 23 2013 simple spirometry completely normal     Assessment & Plan:   Asthma, moderate persistent, well-controlled I question this diagnosis. She has never had benefit from inhaled corticosteroids and she does not have intermittent wheezing or shortness of breath which is consistent with asthma. She has no airflow obstruction on pulmonary function testing today so I think we can order a methacholine challenge test to put this issue to rest.  See discussion under cough below.  Cough Everlena describes over 30 years of a chronic, productive cough which started after a nasty upper respiratory infection at age 82. I explained to her that I think the differential diagnosis here includes bronchiectasis and asthma, however it is unlikely that these would have persisted for 32 years without a diagnosis.  What I think is most likely is that she has mucus production in the morning from postnasal drip.  Plan: -Start nasal steroid at half dose (she had headaches  from them in the past) -DC Flovent -Followup results of methacholine challenge test -Use Chlor-Trimeton and phenylephrine as needed -Saline rinses of sinuses twice a day -Chest x-ray -Followup in one month    Updated Medication List Outpatient Encounter Prescriptions as of 01/23/2013  Medication Sig Dispense Refill  . Aspirin-Acetaminophen-Caffeine (EXCEDRIN PO) Take by mouth daily as needed       . Cholecalciferol (VITAMIN D) 2000 UNITS CAPS Take one by mouth daily       . SUMAtriptan (IMITREX) 100 MG tablet Take 1 tablet (100 mg total) by mouth every 2 (two) hours as needed for migraine. Take one half at onset of headache  10 tablet  6  . SYNTHROID 75 MCG tablet TAKE 1 TABLET (75 MCG TOTAL)  BY MOUTH DAILY.  30 tablet  6  . [DISCONTINUED] fluticasone (FLOVENT HFA) 44 MCG/ACT inhaler Inhale 1 puff into the lungs 2 (two) times daily.       No facility-administered encounter medications on file as of 01/23/2013.

## 2013-01-23 NOTE — Assessment & Plan Note (Signed)
Caitlyn Gregory describes over 30 years of a chronic, productive cough which started after a nasty upper respiratory infection at age 57. I explained to her that I think the differential diagnosis here includes bronchiectasis and asthma, however it is unlikely that these would have persisted for 32 years without a diagnosis.  What I think is most likely is that she has mucus production in the morning from postnasal drip.  Plan: -Start nasal steroid at half dose (she had headaches from them in the past) -DC Flovent -Followup results of methacholine challenge test -Use Chlor-Trimeton and phenylephrine as needed -Saline rinses of sinuses twice a day -Chest x-ray -Followup in one month

## 2013-01-25 NOTE — Progress Notes (Signed)
Quick Note:  Spoke with pt and notified of results per Dr. Wert. Pt verbalized understanding and denied any questions.  ______ 

## 2013-01-29 ENCOUNTER — Encounter: Payer: Self-pay | Admitting: Internal Medicine

## 2013-01-30 ENCOUNTER — Other Ambulatory Visit: Payer: Self-pay | Admitting: Internal Medicine

## 2013-01-30 ENCOUNTER — Ambulatory Visit: Payer: Self-pay | Admitting: Pulmonary Disease

## 2013-01-30 LAB — PULMONARY FUNCTION TEST

## 2013-01-31 ENCOUNTER — Other Ambulatory Visit: Payer: Self-pay | Admitting: Internal Medicine

## 2013-01-31 MED ORDER — LEVOTHYROXINE SODIUM 25 MCG PO TABS: 12.5000 ug | ORAL_TABLET | Freq: Every day | ORAL | Status: DC

## 2013-02-05 ENCOUNTER — Telehealth: Payer: Self-pay | Admitting: *Deleted

## 2013-02-05 ENCOUNTER — Encounter: Payer: Self-pay | Admitting: Pulmonary Disease

## 2013-02-05 NOTE — Telephone Encounter (Signed)
Pt is aware of results. 

## 2013-02-05 NOTE — Telephone Encounter (Signed)
Message copied by Caryl Ada on Mon Feb 05, 2013  9:45 AM ------      Message from: Max Fickle B      Created: Mon Feb 05, 2013  9:42 AM       L,            Please let her know that her PFT's were normal            Thanks      B ------

## 2013-02-12 ENCOUNTER — Encounter: Payer: Self-pay | Admitting: Pulmonary Disease

## 2013-02-20 ENCOUNTER — Encounter: Payer: Self-pay | Admitting: Pulmonary Disease

## 2013-02-20 ENCOUNTER — Ambulatory Visit (INDEPENDENT_AMBULATORY_CARE_PROVIDER_SITE_OTHER): Payer: PRIVATE HEALTH INSURANCE | Admitting: Pulmonary Disease

## 2013-02-20 VITALS — BP 132/80 | HR 75 | Temp 97.9°F | Ht 63.0 in | Wt 120.0 lb

## 2013-02-20 DIAGNOSIS — J4540 Moderate persistent asthma, uncomplicated: Secondary | ICD-10-CM

## 2013-02-20 DIAGNOSIS — J45909 Unspecified asthma, uncomplicated: Secondary | ICD-10-CM

## 2013-02-20 DIAGNOSIS — J309 Allergic rhinitis, unspecified: Secondary | ICD-10-CM

## 2013-02-20 NOTE — Progress Notes (Signed)
Subjective:    Patient ID: RABECCA BIRGE, female    DOB: May 18, 1956, 57 y.o.   MRN: [id number]  Synopsis: Ms. Kohls first saw the [facility] in May 2014 for cough.  She has allergic rhinitis and carries a diagnosis of Asthma, but it is unclear if inhaled corticosteroids have provided significant benefit in the past.  HPI  02/20/2013 ROV >> Katherin says that her cough is about the same since the last visit.  She feel that the Nasacort has helped her sinus congestion and post nasal drip significantly, but she still produces green phlegm in the mornings.  Her dyspnea is minimal, but she still has wheezing.  She has not used albuterol since the last visit.  She has been exercising regularly in that she has been walking 2 miles a day and doing yoga several times a week.  She hasn't taken Flovent since Christmas time.  Past Medical History  Diagnosis Date  . History of migraine headaches   . Thyroid disease   . Asthma   . Allergy   . Fibrocystic breast disease   . Anemia   . Vitamin D deficiency   . Hyperlipidemia      Review of Systems  Constitutional: Negative for fever, chills and fatigue.  HENT: Positive for congestion, rhinorrhea and postnasal drip.   Respiratory: Positive for cough and wheezing. Negative for shortness of breath.   Cardiovascular: Negative for chest pain, palpitations and leg swelling.       Objective:   Physical Exam  Filed Vitals:   02/20/13 0907  BP: 132/80  Pulse: 75  Temp: 97.9 F (36.6 C)  TempSrc: Oral  Height: 5\' 3"  (1.6 m)  Weight: 120 lb (54.432 kg)  SpO2: 95%   Gen: well appearing, no acute distress HEENT: NCAT, EOMi, OP clear,  PULM: CTA B CV: RRR, no mgr, no JVD AB:  soft, nontender, no hsm Ext: warm, no edema, no clubbing, no cyanosis  Jan 23 2013 simple spirometry completely normal 01/30/2013 Full PFT > Ratio 73%, FEV1 2.61 L (115%), TLC 5.98L (127% pred), DLCO 21.6 (120%)     Assessment & Plan:   Asthma, moderate  persistent, well-controlled Seren continues to have minimal dyspnea or need for albuterol.  I explained to her today that my suspicion for asthma is very low.  I think that her productive cough is due mostly to post nasal drip from allergic rhinitis vs other chronic sinusitis.  She is responding well to Nasacort so this is most likely allergic rhinitis.  Plan: -methacholine challenge to rule out asthma -continue Nasacort -if she continues to produce mucus, could check a CT scan of the chest to look for bronchiectasis, but my suspicion for that is low  Rhinitis, allergic As above,  I think this is the cause of her chest congestion, cough and mucus production.  Plan: Continue Nasacort Try zyrtec or allegra generic daily. Try chlorpheniramine/phenylephrine at night    Updated Medication List Outpatient Encounter Prescriptions as of 02/20/2013  Medication Sig Dispense Refill  . Aspirin-Acetaminophen-Caffeine (EXCEDRIN PO) Take by mouth daily as needed       . calcipotriene-betamethasone (TACLONEX) ointment Apply 1 application topically daily.      . Cholecalciferol (VITAMIN D) 2000 UNITS CAPS Take one by mouth daily       . levothyroxine (LEVOTHROID) 25 MCG tablet Take 0.5 tablets (12.5 mcg total) by mouth daily before breakfast. WITH 75 MCG SYNTHROID .  BRAND NAME ONLY  30 tablet  6  . SUMAtriptan (IMITREX) 100 MG tablet Take 1 tablet (100 mg total) by mouth every 2 (two) hours as needed for migraine. Take one half at onset of headache  10 tablet  6  . SYNTHROID 75 MCG tablet TAKE 1 TABLET (75 MCG TOTAL) BY MOUTH DAILY.  30 tablet  6  . triamcinolone (NASACORT) 55 MCG/ACT nasal inhaler Place 1 spray into the nose daily.       No facility-administered encounter medications on file as of 02/20/2013.

## 2013-02-20 NOTE — Assessment & Plan Note (Signed)
Caitlyn Gregory continues to have minimal dyspnea or need for albuterol.  I explained to her today that my suspicion for asthma is very low.  I think that her productive cough is due mostly to post nasal drip from allergic rhinitis vs other chronic sinusitis.  She is responding well to Nasacort so this is most likely allergic rhinitis.  Plan: -methacholine challenge to rule out asthma -continue Nasacort -if she continues to produce mucus, could check a CT scan of the chest to look for bronchiectasis, but my suspicion for that is low

## 2013-02-20 NOTE — Assessment & Plan Note (Signed)
As above,  I think this is the cause of her chest congestion, cough and mucus production.  Plan: Continue Nasacort Try zyrtec or allegra generic daily. Try chlorpheniramine/phenylephrine at night

## 2013-02-20 NOTE — Patient Instructions (Addendum)
We will schedule the Methacholine Challenge test again and call you with a time  During the daytime, take either zyrtec (generic cetirizine) either one pill or one 1/2 pill in the morning (allegra OK too)  At night, take chlorpheniramine-phenylephrine combination tablet before bedtime  Keep taking the nasacort  We will see you back in 3-4 months or sooner if needed

## 2013-02-27 ENCOUNTER — Encounter (HOSPITAL_COMMUNITY): Payer: PRIVATE HEALTH INSURANCE

## 2013-03-07 ENCOUNTER — Ambulatory Visit (HOSPITAL_COMMUNITY)
Admission: RE | Admit: 2013-03-07 | Discharge: 2013-03-07 | Disposition: A | Discharge status: Home / Self Care | Payer: PRIVATE HEALTH INSURANCE | Source: Ambulatory Visit | Attending: Pulmonary Disease | Admitting: Pulmonary Disease

## 2013-03-07 DIAGNOSIS — J45909 Unspecified asthma, uncomplicated: Secondary | ICD-10-CM | POA: Insufficient documentation

## 2013-03-07 DIAGNOSIS — J4540 Moderate persistent asthma, uncomplicated: Secondary | ICD-10-CM

## 2013-03-07 LAB — PULMONARY FUNCTION TEST

## 2013-03-07 MED ORDER — METHACHOLINE 4 MG/ML NEB SOLN
2.0000 mL | Freq: Once | RESPIRATORY_TRACT | Status: AC
  Administered 2013-03-07: 8 mg via RESPIRATORY_TRACT

## 2013-03-07 MED ORDER — METHACHOLINE 0.25 MG/ML NEB SOLN
2.0000 mL | Freq: Once | RESPIRATORY_TRACT | Status: AC
  Administered 2013-03-07: 0.5 mg via RESPIRATORY_TRACT

## 2013-03-07 MED ORDER — ALBUTEROL SULFATE (5 MG/ML) 0.5% IN NEBU
2.5000 mg | INHALATION_SOLUTION | Freq: Once | RESPIRATORY_TRACT | Status: AC
  Administered 2013-03-07: 2.5 mg via RESPIRATORY_TRACT

## 2013-03-07 MED ORDER — METHACHOLINE 16 MG/ML NEB SOLN
2.0000 mL | Freq: Once | RESPIRATORY_TRACT | Status: AC
  Administered 2013-03-07: 32 mg via RESPIRATORY_TRACT

## 2013-03-07 MED ORDER — METHACHOLINE 1 MG/ML NEB SOLN
2.0000 mL | Freq: Once | RESPIRATORY_TRACT | Status: AC
  Administered 2013-03-07: 2 mg via RESPIRATORY_TRACT

## 2013-03-07 MED ORDER — SODIUM CHLORIDE 0.9 % IN NEBU
3.0000 mL | INHALATION_SOLUTION | Freq: Once | RESPIRATORY_TRACT | Status: AC
  Administered 2013-03-07: 3 mL via RESPIRATORY_TRACT

## 2013-03-07 MED ORDER — METHACHOLINE 0.0625 MG/ML NEB SOLN
2.0000 mL | Freq: Once | RESPIRATORY_TRACT | Status: AC
  Administered 2013-03-07: 0.125 mg via RESPIRATORY_TRACT

## 2013-04-02 ENCOUNTER — Encounter: Payer: Self-pay | Admitting: Pulmonary Disease

## 2013-04-02 NOTE — Telephone Encounter (Signed)
Please advise Dr. McQuaid thanks 

## 2013-04-03 ENCOUNTER — Telehealth: Payer: Self-pay | Admitting: Pulmonary Disease

## 2013-04-03 ENCOUNTER — Encounter: Payer: Self-pay | Admitting: Pulmonary Disease

## 2013-04-03 NOTE — Telephone Encounter (Signed)
Verlon Au should be bring this with her to the [place] office today to give to you.

## 2013-04-03 NOTE — Telephone Encounter (Signed)
I called Caitlyn Gregory to let her know that she does not have asthma based on the negative methacholine challenge.

## 2013-04-12 ENCOUNTER — Other Ambulatory Visit: Payer: Self-pay | Admitting: Internal Medicine

## 2013-05-07 ENCOUNTER — Telehealth: Payer: Self-pay | Admitting: Pulmonary Disease

## 2013-05-07 NOTE — Telephone Encounter (Signed)
left messages to call and schd follow up apt. No return call back. Sent letter 05/07/13 °

## 2013-06-05 ENCOUNTER — Encounter: Payer: PRIVATE HEALTH INSURANCE | Admitting: Internal Medicine

## 2013-07-04 ENCOUNTER — Encounter: Payer: Self-pay | Admitting: Internal Medicine

## 2013-07-04 ENCOUNTER — Ambulatory Visit (INDEPENDENT_AMBULATORY_CARE_PROVIDER_SITE_OTHER): Payer: PRIVATE HEALTH INSURANCE | Admitting: Internal Medicine

## 2013-07-04 VITALS — BP 104/84 | HR 91 | Temp 98.0°F | Ht 63.25 in | Wt 116.0 lb

## 2013-07-04 DIAGNOSIS — Z23 Encounter for immunization: Secondary | ICD-10-CM

## 2013-07-04 DIAGNOSIS — J309 Allergic rhinitis, unspecified: Secondary | ICD-10-CM

## 2013-07-04 DIAGNOSIS — Z0000 Encounter for general adult medical examination without abnormal findings: Secondary | ICD-10-CM

## 2013-07-04 DIAGNOSIS — R05 Cough: Secondary | ICD-10-CM

## 2013-07-04 MED ORDER — IPRATROPIUM BROMIDE 0.06 % NA SOLN: NASAL | Status: DC

## 2013-07-04 NOTE — Progress Notes (Signed)
Patient ID: Caitlyn Gregory, female   DOB: [birth year], 57 y.o.   MRN: [id number]    Subjective:    Caitlyn Gregory is a 57 y.o. female who presents for an annual exam. The patient has no complaints today. The patient is sexually active. GYN screening history: last pap: was normal and approximate date 2013 and was normal and last mammogram: approximate date Jan 2014 and was normal. The patient wears seatbelts: yes. The patient participates in regular exercise: yes. Has the patient ever been transfused or tattooed?: no. The patient reports that there is not domestic violence in her life.   Menstrual History: OB History   Grav Para Term Preterm Abortions TAB SAB Ect Mult Living                  Menarche age: 37  No LMP recorded. Patient is postmenopausal.    The following portions of the patient's history were reviewed and updated as appropriate: allergies, current medications, past family history, past medical history, past social history, past surgical history and problem list.  Review of Systems A comprehensive review of systems was negative.    Objective:      General Appearance:    Alert, cooperative, no distress, appears stated age  Head:    Normocephalic, without obvious abnormality, atraumatic  Eyes:    PERRL, conjunctiva/corneas clear, EOM's intact, fundi    benign, both eyes  Ears:    Normal TM's and external ear canals, both ears  Nose:   Nares normal, septum midline, mucosa normal, no drainage    or sinus tenderness  Throat:   Lips, mucosa, and tongue normal; teeth and gums normal  Neck:   Supple, symmetrical, trachea midline, no adenopathy;    thyroid:  no enlargement/tenderness/nodules; no carotid   bruit or JVD  Back:     Symmetric, no curvature, ROM normal, no CVA tenderness  Lungs:     Clear to auscultation bilaterally, respirations unlabored  Chest Wall:    No tenderness or deformity   Heart:    Regular rate and rhythm, S1 and S2 normal, no murmur, rub   or gallop  Breast  Exam:    No tenderness, masses, or nipple abnormality  Abdomen:     Soft, non-tender, bowel sounds active all four quadrants,    no masses, no organomegaly  Extremities:   Extremities normal, atraumatic, no cyanosis or edema  Pulses:   2+ and symmetric all extremities  Skin:   Skin color, texture, turgor normal, no rashes or lesions  Lymph nodes:   Cervical, supraclavicular, and axillary nodes normal  Neurologic:   CNII-XII intact, normal strength, sensation and reflexes    throughout   .    Assessment:   Cough No evidence of asthma by recent PFTS/pulmonary evaluation .  Last allergy testing was normal, basically (per patient, only dust and mold).  Patient feels it is due to mold in the house.  Etiology may be PND or eosinophilic esophagitis. Suggested trial of twice daily saline lavage, atrovent nasal spray  at bedtime and stopping nasocort as a trial.   Rhinitis, allergic No significant change with steroid nasal spray. Suggested stopping it for a trial   Routine general medical examination at a health care facility Annual comprehensive exam was done including breast,excluding  pelvic and PAP smear. All screenings have been addressed .    Updated Medication List Outpatient Encounter Prescriptions as of 07/04/2013  Medication Sig  . Aspirin-Acetaminophen-Caffeine (EXCEDRIN PO) Take by mouth  daily as needed   . calcipotriene-betamethasone (TACLONEX) ointment Apply 1 application topically daily.  . chlorpheniramine (CHLOR-TRIMETON) 4 MG tablet Take 4 mg by mouth every 4 (four) hours as needed for allergies.  . Cholecalciferol (VITAMIN D) 2000 UNITS CAPS Take one by mouth daily   . fexofenadine (ALLEGRA) 180 MG tablet Take 180 mg by mouth daily.  . SUMAtriptan (IMITREX) 100 MG tablet Take 1 tablet (100 mg total) by mouth every 2 (two) hours as needed for migraine. Take one half at onset of headache  . SYNTHROID 75 MCG tablet TAKE 1 TABLET BY MOUTH EVERY DAY  . [DISCONTINUED]  triamcinolone (NASACORT) 55 MCG/ACT nasal inhaler Place 1 spray into the nose daily.  Marland Kitchen ipratropium (ATROVENT) 0.06 % nasal spray 2 squirts each nostril at bedtime and in the morning  . levothyroxine (LEVOTHROID) 25 MCG tablet Take 0.5 tablets (12.5 mcg total) by mouth daily before breakfast. WITH 75 MCG SYNTHROID .  BRAND NAME ONLY

## 2013-07-06 ENCOUNTER — Encounter: Payer: Self-pay | Admitting: Internal Medicine

## 2013-07-06 DIAGNOSIS — Z0000 Encounter for general adult medical examination without abnormal findings: Secondary | ICD-10-CM | POA: Insufficient documentation

## 2013-07-06 NOTE — Assessment & Plan Note (Signed)
Annual comprehensive exam was done including breast, excluding pelvic and PAP smear. All screenings have been addressed .  

## 2013-07-06 NOTE — Assessment & Plan Note (Signed)
No significant change with steroid nasal spray. Suggested stopping it for a trial

## 2013-07-06 NOTE — Assessment & Plan Note (Addendum)
No evidence of asthma by recent PFTS/pulmonary evaluation .  Last allergy testing was normal, basically (per patient, only dust and mold).  Patient feels it is due to mold in the house.  Etiology may be PND or eosinophilic esophagitis. Suggested trial of twice daily saline lavage, atrovent nasal spray  at bedtime and stopping nasocort as a trial.

## 2013-07-24 ENCOUNTER — Telehealth: Payer: Self-pay | Admitting: *Deleted

## 2013-07-24 ENCOUNTER — Other Ambulatory Visit (INDEPENDENT_AMBULATORY_CARE_PROVIDER_SITE_OTHER): Payer: PRIVATE HEALTH INSURANCE

## 2013-07-24 DIAGNOSIS — R5381 Other malaise: Secondary | ICD-10-CM

## 2013-07-24 DIAGNOSIS — E785 Hyperlipidemia, unspecified: Secondary | ICD-10-CM

## 2013-07-24 LAB — CBC WITH DIFFERENTIAL/PLATELET
Basophils Absolute: 0 10*3/uL (ref 0.0–0.1)
Eosinophils Absolute: 0.1 10*3/uL (ref 0.0–0.7)
HCT: 41.1 % (ref 36.0–46.0)
Hemoglobin: 14.1 g/dL (ref 12.0–15.0)
Lymphs Abs: 1 10*3/uL (ref 0.7–4.0)
MCHC: 34.4 g/dL (ref 30.0–36.0)
MCV: 98.7 fl (ref 78.0–100.0)
Monocytes Absolute: 0.3 10*3/uL (ref 0.1–1.0)
Monocytes Relative: 9.7 % (ref 3.0–12.0)
Neutro Abs: 1.8 10*3/uL (ref 1.4–7.7)
Platelets: 199 10*3/uL (ref 150.0–400.0)
RDW: 12.2 % (ref 11.5–14.6)

## 2013-07-24 LAB — LIPID PANEL: Total CHOL/HDL Ratio: 2

## 2013-07-24 LAB — COMPREHENSIVE METABOLIC PANEL
ALT: 17 U/L (ref 0–35)
Albumin: 4.3 g/dL (ref 3.5–5.2)
CO2: 28 mEq/L (ref 19–32)
Chloride: 101 mEq/L (ref 96–112)
GFR: 93.3 mL/min (ref >60.00)
Glucose, Bld: 90 mg/dL (ref 70–99)
Potassium: 4.3 mEq/L (ref 3.5–5.1)
Sodium: 135 mEq/L (ref 135–145)
Total Bilirubin: 0.8 mg/dL (ref 0.3–1.2)
Total Protein: 6.9 g/dL (ref 6.0–8.3)

## 2013-07-24 NOTE — Telephone Encounter (Signed)
What labs and dx?  

## 2013-07-25 LAB — TSH: TSH: 2.31 u[IU]/mL (ref 0.35–5.50)

## 2013-07-27 ENCOUNTER — Encounter: Payer: Self-pay | Admitting: Internal Medicine

## 2013-08-15 ENCOUNTER — Other Ambulatory Visit: Payer: Self-pay | Admitting: Internal Medicine

## 2013-08-15 ENCOUNTER — Other Ambulatory Visit: Payer: Self-pay | Admitting: *Deleted

## 2013-08-15 MED ORDER — SUMATRIPTAN SUCCINATE 100 MG PO TABS: 100.0000 mg | ORAL_TABLET | ORAL | Status: DC | PRN

## 2013-08-18 ENCOUNTER — Encounter: Payer: Self-pay | Admitting: Internal Medicine

## 2013-10-26 ENCOUNTER — Other Ambulatory Visit: Payer: Self-pay | Admitting: Internal Medicine

## 2013-12-20 ENCOUNTER — Telehealth: Payer: Self-pay | Admitting: Internal Medicine

## 2013-12-20 ENCOUNTER — Other Ambulatory Visit: Payer: Self-pay | Admitting: Internal Medicine

## 2013-12-20 NOTE — Telephone Encounter (Signed)
The patient is feeling tired and she wants labs to check her Vitamin D and thyroid levels.

## 2013-12-21 NOTE — Telephone Encounter (Signed)
Please advise 

## 2013-12-21 NOTE — Telephone Encounter (Signed)
Pt notified, appt scheduled for 12/25/13

## 2013-12-21 NOTE — Telephone Encounter (Signed)
i would prefer her to come in before ordering labs since it has been nearly 6 months since last visit.

## 2013-12-25 ENCOUNTER — Ambulatory Visit (INDEPENDENT_AMBULATORY_CARE_PROVIDER_SITE_OTHER): Payer: PRIVATE HEALTH INSURANCE | Admitting: Internal Medicine

## 2013-12-25 ENCOUNTER — Encounter: Payer: Self-pay | Admitting: Internal Medicine

## 2013-12-25 VITALS — BP 118/72 | HR 83 | Temp 97.5°F | Resp 16 | Wt 117.5 lb

## 2013-12-25 DIAGNOSIS — E785 Hyperlipidemia, unspecified: Secondary | ICD-10-CM

## 2013-12-25 DIAGNOSIS — R5383 Other fatigue: Secondary | ICD-10-CM

## 2013-12-25 DIAGNOSIS — N951 Menopausal and female climacteric states: Secondary | ICD-10-CM

## 2013-12-25 DIAGNOSIS — E039 Hypothyroidism, unspecified: Secondary | ICD-10-CM

## 2013-12-25 DIAGNOSIS — E559 Vitamin D deficiency, unspecified: Secondary | ICD-10-CM

## 2013-12-25 DIAGNOSIS — D7589 Other specified diseases of blood and blood-forming organs: Secondary | ICD-10-CM

## 2013-12-25 DIAGNOSIS — E162 Hypoglycemia, unspecified: Secondary | ICD-10-CM

## 2013-12-25 DIAGNOSIS — R5381 Other malaise: Secondary | ICD-10-CM

## 2013-12-25 LAB — CBC WITH DIFFERENTIAL/PLATELET
BASOS PCT: 0.8 % (ref 0.0–3.0)
Basophils Absolute: 0 10*3/uL (ref 0.0–0.1)
EOS PCT: 2.1 % (ref 0.0–5.0)
Eosinophils Absolute: 0.1 10*3/uL (ref 0.0–0.7)
HCT: 43.7 % (ref 36.0–46.0)
Hemoglobin: 14.8 g/dL (ref 12.0–15.0)
LYMPHS PCT: 23.5 % (ref 12.0–46.0)
Lymphs Abs: 1 10*3/uL (ref 0.7–4.0)
MCHC: 33.9 g/dL (ref 30.0–36.0)
MCV: 100.5 fl — AB (ref 78.0–100.0)
MONOS PCT: 11.1 % (ref 3.0–12.0)
Monocytes Absolute: 0.5 10*3/uL (ref 0.1–1.0)
NEUTROS PCT: 62.5 % (ref 43.0–77.0)
Neutro Abs: 2.6 10*3/uL (ref 1.4–7.7)
Platelets: 225 10*3/uL (ref 150.0–400.0)
RBC: 4.35 Mil/uL (ref 3.87–5.11)
RDW: 12.1 % (ref 11.5–14.6)
WBC: 4.1 10*3/uL — AB (ref 4.5–10.5)

## 2013-12-25 LAB — COMPREHENSIVE METABOLIC PANEL
ALBUMIN: 4.5 g/dL (ref 3.5–5.2)
ALT: 19 U/L (ref 0–35)
AST: 22 U/L (ref 0–37)
Alkaline Phosphatase: 56 U/L (ref 39–117)
BUN: 11 mg/dL (ref 6–23)
CALCIUM: 9.9 mg/dL (ref 8.4–10.5)
CHLORIDE: 101 meq/L (ref 96–112)
CO2: 30 mEq/L (ref 19–32)
Creatinine, Ser: 0.8 mg/dL (ref 0.4–1.2)
GFR: 82.08 mL/min (ref >60.00)
Glucose, Bld: 63 mg/dL — ABNORMAL LOW (ref 70–99)
POTASSIUM: 4 meq/L (ref 3.5–5.1)
Sodium: 138 mEq/L (ref 135–145)
Total Bilirubin: 0.6 mg/dL (ref 0.3–1.2)
Total Protein: 7.2 g/dL (ref 6.0–8.3)

## 2013-12-25 LAB — LIPID PANEL
CHOL/HDL RATIO: 2
CHOLESTEROL: 187 mg/dL (ref 0–200)
HDL: 84.1 mg/dL (ref >39.00)
LDL CALC: 92 mg/dL (ref 0–99)
Triglycerides: 55 mg/dL (ref 0.0–149.0)
VLDL: 11 mg/dL (ref 0.0–40.0)

## 2013-12-25 LAB — TSH: TSH: 1.45 u[IU]/mL (ref 0.35–5.50)

## 2013-12-25 NOTE — Patient Instructions (Signed)
Please consider starting a medication to help manage your anxiety  paxil  Buspirone  I will send you information about both of these medications (my computer won't print from their website at the moment )

## 2013-12-25 NOTE — Progress Notes (Signed)
Patient being seen for fatigue noticeably worse last month with cold hands and feet. Patient concerned about thyroid.

## 2013-12-25 NOTE — Progress Notes (Signed)
Pre-visit discussion using our clinic review tool. No additional management support is needed unless otherwise documented below in the visit note.  

## 2013-12-26 ENCOUNTER — Encounter: Payer: Self-pay | Admitting: Internal Medicine

## 2013-12-26 LAB — VITAMIN D 25 HYDROXY (VIT D DEFICIENCY, FRACTURES): Vit D, 25-Hydroxy: 57 ng/mL (ref 30–89)

## 2013-12-26 NOTE — Assessment & Plan Note (Signed)
Thyroid function is WNL on current dose.  No current changes needed.  Lab Results  Component Value Date   TSH 1.45 12/25/2013

## 2013-12-26 NOTE — Progress Notes (Signed)
Patient ID: Caitlyn Gregory, female   DOB: 11/05/1955, 58 y.o.   MRN: [id number]  Patient Active Problem List   Diagnosis Date Noted  . Routine general medical examination at a health care facility 07/06/2013  . Cough 01/23/2013  . Postmenopausal atrophic vaginitis 01/07/2013  . Menopausal syndrome 03/29/2012  . Screening for cervical cancer 09/02/2011  . Migraine headache 09/02/2011  . Hypothyroidism 09/02/2011  . Rhinitis, allergic 09/02/2011  . Asthma, moderate persistent, well-controlled 09/02/2011  . Hypovitaminosis D 09/02/2011  . Screening for colon cancer 09/02/2011    Subjective:  CC:   Chief Complaint  Patient presents with  . Acute Visit    Fatigue, cold hands and feet. for about a month has been more noticeable.    HPI:   Caitlyn Gregory is a 58 y.o. female who presents for Increased fatigue ,  Not sleeping well, stressed out by juggling family, business cand church responsibilities. Long discussion today about her high expectations of others,  Constantly  beingly disappointed by the laziness and lack of commitment and attention to detail. Despite exercising regularly, eating well, doesn't feel any better.    Past Medical History  Diagnosis Date  . History of migraine headaches   . Thyroid disease   . Asthma   . Allergy   . Fibrocystic breast disease   . Anemia   . Vitamin D deficiency   . Hyperlipidemia     Past Surgical History  Procedure Laterality Date  . Chin implant  [id number]  . Appendectomy  1967    secondary to rupture  . Frontalis suspension  2008  . Hernia repair  2008       The following portions of the patient's history were reviewed and updated as appropriate: Allergies, current medications, and problem list.    Review of Systems:   Patient denies headache, fevers, malaise, unintentional weight loss, skin rash, eye pain, sinus congestion and sinus pain, sore throat, dysphagia,  hemoptysis , cough, dyspnea, wheezing, chest pain, palpitations,  orthopnea, edema, abdominal pain, nausea, melena, diarrhea, constipation, flank pain, dysuria, hematuria, urinary  Frequency, nocturia, numbness, tingling, seizures,  Focal weakness, Loss of consciousness,  Tremor, insomnia, depression, anxiety, and suicidal ideation.     History   Social History  . Marital Status: Married    Spouse Name: N/A    Number of Children: N/A  . Years of Education: N/A   Occupational History  . Not on file.   Social History Main Topics  . Smoking status: Never Smoker   . Smokeless tobacco: Never Used  . Alcohol Use: No  . Drug Use: No  . Sexual Activity: Not on file   Other Topics Concern  . Not on file   Social History Narrative  . No narrative on file    Objective:  Filed Vitals:   12/25/13 1346  BP: 118/72  Pulse: 83  Temp: 97.5 F (36.4 C)  Resp: 16     General appearance: alert, cooperative and appears stated age Ears: normal TM's and external ear canals both ears Throat: lips, mucosa, and tongue normal; teeth and gums normal Neck: no adenopathy, no carotid bruit, supple, symmetrical, trachea midline and thyroid not enlarged, symmetric, no tenderness/mass/nodules Back: symmetric, no curvature. ROM normal. No CVA tenderness. Lungs: clear to auscultation bilaterally Heart: regular rate and rhythm, S1, S2 normal, no murmur, click, rub or gallop Abdomen: soft, non-tender; bowel sounds normal; no masses,  no organomegaly Pulses: 2+ and symmetric Skin: Skin color, texture, turgor normal.  No rashes or lesions Lymph nodes: Cervical, supraclavicular, and axillary nodes normal.  Assessment and Plan:  Hypothyroidism Thyroid function is WNL on current dose.  No current changes needed.  Lab Results  Component Value Date   TSH 1.45 12/25/2013     Menopausal syndrome With mood swings,  Hot flashes  But no loss of libido.  Discussed alternatives to HRT to manage mood disorder including paxil and buspirone  A total of 25 minutes of face  to face time was spent with patient more than half of which was spent in counselling and coordination of care   Updated Medication List Outpatient Encounter Prescriptions as of 12/25/2013  Medication Sig  . Aspirin-Acetaminophen-Caffeine (EXCEDRIN PO) Take by mouth daily as needed   . calcipotriene-betamethasone (TACLONEX) ointment Apply 1 application topically daily.  . Cholecalciferol (VITAMIN D) 2000 UNITS CAPS Take one by mouth daily   . SUMAtriptan (IMITREX) 100 MG tablet TAKE 1 TABLET BY MOUTH EVERY 2 HOURS AS NEEDED FOR MIGRAINE, TAKE ONE-HALF TABLET AT ONSET OF HEADACHE  . SYNTHROID 75 MCG tablet TAKE 1 TABLET BY MOUTH EVERY DAY  . [DISCONTINUED] chlorpheniramine (CHLOR-TRIMETON) 4 MG tablet Take 4 mg by mouth every 4 (four) hours as needed for allergies.  . [DISCONTINUED] fexofenadine (ALLEGRA) 180 MG tablet Take 180 mg by mouth daily.  . [DISCONTINUED] ipratropium (ATROVENT) 0.06 % nasal spray 2 squirts each nostril at bedtime and in the morning  . [DISCONTINUED] levothyroxine (LEVOTHROID) 25 MCG tablet Take 0.5 tablets (12.5 mcg total) by mouth daily before breakfast. WITH 75 MCG SYNTHROID .  BRAND NAME ONLY     Orders Placed This Encounter  Procedures  . Lipid panel  . TSH  . CBC with Differential  . Comprehensive metabolic panel  . Vit D  25 hydroxy (rtn osteoporosis monitoring)    No Follow-up on file.

## 2013-12-26 NOTE — Assessment & Plan Note (Addendum)
With mood swings,  Hot flashes  But no loss of libido.  Discussed alternatives to HRT to manage mood disorder including paxil and buspirone

## 2013-12-27 ENCOUNTER — Encounter: Payer: Self-pay | Admitting: Internal Medicine

## 2013-12-27 NOTE — Addendum Note (Signed)
Addended by: Sherlene ShamsULLO, TERESA L on: 12/27/2013 07:06 AM   Modules accepted: Orders

## 2013-12-28 ENCOUNTER — Other Ambulatory Visit (INDEPENDENT_AMBULATORY_CARE_PROVIDER_SITE_OTHER): Payer: PRIVATE HEALTH INSURANCE

## 2013-12-28 DIAGNOSIS — E162 Hypoglycemia, unspecified: Secondary | ICD-10-CM

## 2013-12-28 DIAGNOSIS — D7589 Other specified diseases of blood and blood-forming organs: Secondary | ICD-10-CM

## 2013-12-28 LAB — VITAMIN B12: VITAMIN B 12: 428 pg/mL (ref 211–911)

## 2013-12-28 LAB — HEMOGLOBIN A1C: HEMOGLOBIN A1C: 4.9 % (ref 4.6–6.5)

## 2013-12-29 LAB — FOLATE RBC: RBC Folate: 517 ng/mL (ref >280)

## 2014-02-28 ENCOUNTER — Ambulatory Visit (INDEPENDENT_AMBULATORY_CARE_PROVIDER_SITE_OTHER): Payer: PRIVATE HEALTH INSURANCE | Admitting: Adult Health

## 2014-02-28 ENCOUNTER — Encounter: Payer: Self-pay | Admitting: Adult Health

## 2014-02-28 VITALS — BP 126/74 | HR 83 | Temp 98.9°F | Resp 14 | Wt 116.5 lb

## 2014-02-28 DIAGNOSIS — N949 Unspecified condition associated with female genital organs and menstrual cycle: Secondary | ICD-10-CM | POA: Insufficient documentation

## 2014-02-28 NOTE — Patient Instructions (Signed)
  I will notify you of results once they are available as well as any further instructions.  If symptoms persist then I recommend we send you for a pelvic ultrasound.

## 2014-02-28 NOTE — Addendum Note (Signed)
Addended by: Josph MachoANCE, KIMBERLY A on: 02/28/2014 01:34 PM   Modules accepted: Orders

## 2014-02-28 NOTE — Progress Notes (Signed)
Pre visit review using our clinic review tool, if applicable. No additional management support is needed unless otherwise documented below in the visit note. 

## 2014-02-28 NOTE — Progress Notes (Signed)
Patient ID: Caitlyn Gregory, female   DOB: 12/21/1955, 58 y.o.   MRN: [id number]   Subjective:    Patient ID: Caitlyn Gregory, female    DOB: 11/09/1955, 58 y.o.   MRN: [id number]  HPI  Pt is a pleasant 58 y/o female who is currently being treated by her Urologist, Dr. Sheppard PentonWolf, for UTI with macrobid. She reports that her culture came back negative but that he explained that just because her culture is negative does not mean she doesn't have a UTI. She was confused about this. Reports that Dr. Sheppard PentonWolf told her to continue with the macrobid.  She is feeling pressure in and around her vagina, frequency, fullness feeling. Concerned about prolapse or "other problem".  Past Medical History  Diagnosis Date  . History of migraine headaches   . Thyroid disease   . Asthma   . Allergy   . Fibrocystic breast disease   . Anemia   . Vitamin D deficiency   . Hyperlipidemia     Current Outpatient Prescriptions on File Prior to Visit  Medication Sig Dispense Refill  . Aspirin-Acetaminophen-Caffeine (EXCEDRIN PO) Take by mouth daily as needed       . calcipotriene-betamethasone (TACLONEX) ointment Apply 1 application topically daily.      . Cholecalciferol (VITAMIN D) 2000 UNITS CAPS Take one by mouth daily       . SUMAtriptan (IMITREX) 100 MG tablet TAKE 1 TABLET BY MOUTH EVERY 2 HOURS AS NEEDED FOR MIGRAINE, TAKE ONE-HALF TABLET AT ONSET OF HEADACHE  25 tablet  1  . SYNTHROID 75 MCG tablet TAKE 1 TABLET BY MOUTH EVERY DAY  30 tablet  11   No current facility-administered medications on file prior to visit.     Review of Systems  Constitutional: Negative.   HENT: Negative.   Eyes: Negative.   Respiratory: Negative.   Cardiovascular: Negative.   Gastrointestinal: Negative.   Endocrine: Negative.   Genitourinary: Negative.   Musculoskeletal: Negative.   Skin: Negative.   Allergic/Immunologic: Negative.   Neurological: Negative.   Hematological: Negative.   Psychiatric/Behavioral: Negative.          Objective:  BP 126/74  Pulse 83  Temp(Src) 98.9 F (37.2 C) (Oral)  Resp 14  Wt 116 lb 8 oz (52.844 kg)  SpO2 97%   Physical Exam  Genitourinary: There is no rash, tenderness, lesion or injury on the right labia. There is no rash, tenderness, lesion or injury on the left labia. No erythema, tenderness or bleeding around the vagina. No foreign body around the vagina. No signs of injury around the vagina. Vaginal discharge found.      Assessment & Plan:   1. Vaginal discomfort She is currently on macrobid for frequency. She was started on this by Dr. Sheppard PentonWolf. Culture came back negative but he told her to continue. Speculum exam revealed some white discharge which I am sending a wet prep to evaluate for BV. Otherwise, normal exam. If no BV then recommend pelvic ultrasound.

## 2014-03-04 LAB — WET PREP BY MOLECULAR PROBE
CANDIDA SPECIES: NEGATIVE
GARDNERELLA VAGINALIS: POSITIVE — AB
TRICHOMONAS VAG: NEGATIVE

## 2014-03-05 ENCOUNTER — Encounter: Payer: Self-pay | Admitting: Adult Health

## 2014-03-05 ENCOUNTER — Other Ambulatory Visit: Payer: Self-pay | Admitting: Adult Health

## 2014-03-05 MED ORDER — METRONIDAZOLE 500 MG PO TABS: 500.0000 mg | ORAL_TABLET | Freq: Two times a day (BID) | ORAL | Status: DC

## 2014-03-05 NOTE — Progress Notes (Signed)
Spoke to patient and notified her that Rx was sent to pharmacy. Patient verbalized understanding and said she would pick it up today.

## 2014-06-14 ENCOUNTER — Other Ambulatory Visit: Payer: Self-pay

## 2014-07-09 ENCOUNTER — Ambulatory Visit (INDEPENDENT_AMBULATORY_CARE_PROVIDER_SITE_OTHER): Payer: PRIVATE HEALTH INSURANCE | Admitting: *Deleted

## 2014-07-09 ENCOUNTER — Other Ambulatory Visit (HOSPITAL_COMMUNITY)
Admission: RE | Admit: 2014-07-09 | Discharge: 2014-07-09 | Disposition: A | Discharge status: Home / Self Care | Payer: PRIVATE HEALTH INSURANCE | Source: Ambulatory Visit | Attending: Internal Medicine | Admitting: Internal Medicine

## 2014-07-09 ENCOUNTER — Ambulatory Visit (INDEPENDENT_AMBULATORY_CARE_PROVIDER_SITE_OTHER): Payer: PRIVATE HEALTH INSURANCE | Admitting: Internal Medicine

## 2014-07-09 ENCOUNTER — Encounter: Payer: Self-pay | Admitting: Internal Medicine

## 2014-07-09 VITALS — BP 128/84 | HR 73 | Temp 97.9°F | Resp 16 | Ht 63.0 in | Wt 113.8 lb

## 2014-07-09 DIAGNOSIS — Z0000 Encounter for general adult medical examination without abnormal findings: Secondary | ICD-10-CM

## 2014-07-09 DIAGNOSIS — Z1151 Encounter for screening for human papillomavirus (HPV): Secondary | ICD-10-CM | POA: Diagnosis present

## 2014-07-09 DIAGNOSIS — Z01419 Encounter for gynecological examination (general) (routine) without abnormal findings: Secondary | ICD-10-CM | POA: Diagnosis present

## 2014-07-09 DIAGNOSIS — Z23 Encounter for immunization: Secondary | ICD-10-CM

## 2014-07-09 DIAGNOSIS — Z124 Encounter for screening for malignant neoplasm of cervix: Secondary | ICD-10-CM

## 2014-07-09 MED ORDER — SUMATRIPTAN SUCCINATE 100 MG PO TABS: ORAL_TABLET | ORAL | Status: DC

## 2014-07-09 MED ORDER — LEVOTHYROXINE SODIUM 75 MCG PO TABS: ORAL_TABLET | ORAL | Status: DC

## 2014-07-09 NOTE — Patient Instructions (Signed)
You had your annual  wellness exam today.  We will repeat your PAP smear in 2018, sooner if needed   We will schedule your mammogram soon  Health Maintenance Adopting a healthy lifestyle and getting preventive care can go a long way to promote health and wellness. Talk with your health care provider about what schedule of regular examinations is right for you. This is a good chance for you to check in with your provider about disease prevention and staying healthy. In between checkups, there are plenty of things you can do on your own. Experts have done a lot of research about which lifestyle changes and preventive measures are most likely to keep you healthy. Ask your health care provider for more information. WEIGHT AND DIET  Eat a healthy diet  Be sure to include plenty of vegetables, fruits, low-fat dairy products, and lean protein.  Do not eat a lot of foods high in solid fats, added sugars, or salt.  Get regular exercise. This is one of the most important things you can do for your health.  Most adults should exercise for at least 150 minutes each week. The exercise should increase your heart rate and make you sweat (moderate-intensity exercise).  Most adults should also do strengthening exercises at least twice a week. This is in addition to the moderate-intensity exercise.  Maintain a healthy weight  Body mass index (BMI) is a measurement that can be used to identify possible weight problems. It estimates body fat based on height and weight. Your health care provider can help determine your BMI and help you achieve or maintain a healthy weight.  For females 81 years of age and older:   A BMI below 18.5 is considered underweight.  A BMI of 18.5 to 24.9 is normal.  A BMI of 25 to 29.9 is considered overweight.  A BMI of 30 and above is considered obese.  Watch levels of cholesterol and blood lipids  You should start having your blood tested for lipids and cholesterol at 57  years of age, then have this test every 5 years.  You may need to have your cholesterol levels checked more often if:  Your lipid or cholesterol levels are high.  You are older than 57 years of age.  You are at high risk for heart disease.  CANCER SCREENING   Lung Cancer  Lung cancer screening is recommended for adults 1-70 years old who are at high risk for lung cancer because of a history of smoking.  A yearly low-dose CT scan of the lungs is recommended for people who:  Currently smoke.  Have quit within the past 15 years.  Have at least a 30-pack-year history of smoking. A pack year is smoking an average of one pack of cigarettes a day for 1 year.  Yearly screening should continue until it has been 15 years since you quit.  Yearly screening should stop if you develop a health problem that would prevent you from having lung cancer treatment.  Breast Cancer  Practice breast self-awareness. This means understanding how your breasts normally appear and feel.  It also means doing regular breast self-exams. Let your health care provider know about any changes, no matter how small.  If you are in your 20s or 30s, you should have a clinical breast exam (CBE) by a health care provider every 1-3 years as part of a regular health exam.  If you are 51 or older, have a CBE every year. Also consider having  a breast X-ray (mammogram) every year.  If you have a family history of breast cancer, talk to your health care provider about genetic screening.  If you are at high risk for breast cancer, talk to your health care provider about having an MRI and a mammogram every year.  Breast cancer gene (BRCA) assessment is recommended for women who have family members with BRCA-related cancers. BRCA-related cancers include:  Breast.  Ovarian.  Tubal.  Peritoneal cancers.  Results of the assessment will determine the need for genetic counseling and BRCA1 and BRCA2 testing. Cervical  Cancer Routine pelvic examinations to screen for cervical cancer are no longer recommended for nonpregnant women who are considered low risk for cancer of the pelvic organs (ovaries, uterus, and vagina) and who do not have symptoms. A pelvic examination may be necessary if you have symptoms including those associated with pelvic infections. Ask your health care provider if a screening pelvic exam is right for you.   The Pap test is the screening test for cervical cancer for women who are considered at risk.  If you had a hysterectomy for a problem that was not cancer or a condition that could lead to cancer, then you no longer need Pap tests.  If you are older than 65 years, and you have had normal Pap tests for the past 10 years, you no longer need to have Pap tests.  If you have had past treatment for cervical cancer or a condition that could lead to cancer, you need Pap tests and screening for cancer for at least 20 years after your treatment.  If you no longer get a Pap test, assess your risk factors if they change (such as having a new sexual partner). This can affect whether you should start being screened again.  Some women have medical problems that increase their chance of getting cervical cancer. If this is the case for you, your health care provider may recommend more frequent screening and Pap tests.  The human papillomavirus (HPV) test is another test that may be used for cervical cancer screening. The HPV test looks for the virus that can cause cell changes in the cervix. The cells collected during the Pap test can be tested for HPV.  The HPV test can be used to screen women 93 years of age and older. Getting tested for HPV can extend the interval between normal Pap tests from three to five years.  An HPV test also should be used to screen women of any age who have unclear Pap test results.  After 57 years of age, women should have HPV testing as often as Pap tests.  Colorectal  Cancer  This type of cancer can be detected and often prevented.  Routine colorectal cancer screening usually begins at 57 years of age and continues through 57 years of age.  Your health care provider may recommend screening at an earlier age if you have risk factors for colon cancer.  Your health care provider may also recommend using home test kits to check for hidden blood in the stool.  A small camera at the end of a tube can be used to examine your colon directly (sigmoidoscopy or colonoscopy). This is done to check for the earliest forms of colorectal cancer.  Routine screening usually begins at age 63.  Direct examination of the colon should be repeated every 5-10 years through 56 years of age. However, you may need to be screened more often if early forms of precancerous polyps  or small growths are found. Skin Cancer  Check your skin from head to toe regularly.  Tell your health care provider about any new moles or changes in moles, especially if there is a change in a mole's shape or color.  Also tell your health care provider if you have a mole that is larger than the size of a pencil eraser.  Always use sunscreen. Apply sunscreen liberally and repeatedly throughout the day.  Protect yourself by wearing long sleeves, pants, a wide-brimmed hat, and sunglasses whenever you are outside. HEART DISEASE, DIABETES, AND HIGH BLOOD PRESSURE   Have your blood pressure checked at least every 1-2 years. High blood pressure causes heart disease and increases the risk of stroke.  If you are between 22 years and 27 years old, ask your health care provider if you should take aspirin to prevent strokes.  Have regular diabetes screenings. This involves taking a blood sample to check your fasting blood sugar level.  If you are at a normal weight and have a low risk for diabetes, have this test once every three years after 57 years of age.  If you are overweight and have a high risk for  diabetes, consider being tested at a younger age or more often. PREVENTING INFECTION  Hepatitis B  If you have a higher risk for hepatitis B, you should be screened for this virus. You are considered at high risk for hepatitis B if:  You were born in a country where hepatitis B is common. Ask your health care provider which countries are considered high risk.  Your parents were born in a high-risk country, and you have not been immunized against hepatitis B (hepatitis B vaccine).  You have HIV or AIDS.  You use needles to inject street drugs.  You live with someone who has hepatitis B.  You have had sex with someone who has hepatitis B.  You get hemodialysis treatment.  You take certain medicines for conditions, including cancer, organ transplantation, and autoimmune conditions. Hepatitis C  Blood testing is recommended for:  Everyone born from [birth year] through 1964.  Anyone with known risk factors for hepatitis C. Sexually transmitted infections (STIs)  You should be screened for sexually transmitted infections (STIs) including gonorrhea and chlamydia if:  You are sexually active and are younger than 57 years of age.  You are older than 56 years of age and your health care provider tells you that you are at risk for this type of infection.  Your sexual activity has changed since you were last screened and you are at an increased risk for chlamydia or gonorrhea. Ask your health care provider if you are at risk.  If you do not have HIV, but are at risk, it may be recommended that you take a prescription medicine daily to prevent HIV infection. This is called pre-exposure prophylaxis (PrEP). You are considered at risk if:  You are sexually active and do not regularly use condoms or know the HIV status of your partner(s).  You take drugs by injection.  You are sexually active with a partner who has HIV. Talk with your health care provider about whether you are at high risk of  being infected with HIV. If you choose to begin PrEP, you should first be tested for HIV. You should then be tested every 3 months for as long as you are taking PrEP.  PREGNANCY   If you are premenopausal and you may become pregnant, ask your health care provider about  preconception counseling.  If you may become pregnant, take 400 to 800 micrograms (mcg) of folic acid every day.  If you want to prevent pregnancy, talk to your health care provider about birth control (contraception). OSTEOPOROSIS AND MENOPAUSE   Osteoporosis is a disease in which the bones lose minerals and strength with aging. This can result in serious bone fractures. Your risk for osteoporosis can be identified using a bone density scan.  If you are 55 years of age or older, or if you are at risk for osteoporosis and fractures, ask your health care provider if you should be screened.  Ask your health care provider whether you should take a calcium or vitamin D supplement to lower your risk for osteoporosis.  Menopause may have certain physical symptoms and risks.  Hormone replacement therapy may reduce some of these symptoms and risks. Talk to your health care provider about whether hormone replacement therapy is right for you.  HOME CARE INSTRUCTIONS   Schedule regular health, dental, and eye exams.  Stay current with your immunizations.   Do not use any tobacco products including cigarettes, chewing tobacco, or electronic cigarettes.  If you are pregnant, do not drink alcohol.  If you are breastfeeding, limit how much and how often you drink alcohol.  Limit alcohol intake to no more than 1 drink per day for nonpregnant women. One drink equals 12 ounces of beer, 5 ounces of wine, or 1 ounces of hard liquor.  Do not use street drugs.  Do not share needles.  Ask your health care provider for help if you need support or information about quitting drugs.  Tell your health care provider if you often feel  depressed.  Tell your health care provider if you have ever been abused or do not feel safe at home. Document Released: 03/01/2011 Document Revised: 12/31/2013 Document Reviewed: 07/18/2013 [facility] Patient Information 2015 [place], [place]. This information is not intended to replace advice given to you by your health care provider. Make sure you discuss any questions you have with your health care provider.

## 2014-07-09 NOTE — Progress Notes (Signed)
Pre-visit discussion using our clinic review tool. No additional management support is needed unless otherwise documented below in the visit note.  

## 2014-07-10 LAB — CYTOLOGY - PAP

## 2014-07-10 NOTE — Progress Notes (Signed)
Patient ID: Caitlyn Gregory, female   DOB: 05/07/1956, 58 y.o.   MRN: [id number]  Subjective:     Caitlyn Gregory is a 58 y.o. female and is here for a comprehensive physical exam. The patient reports no problems.  History   Social History  . Marital Status: Married    Spouse Name: N/A    Number of Children: N/A  . Years of Education: N/A   Occupational History  . Not on file.   Social History Main Topics  . Smoking status: Never Smoker   . Smokeless tobacco: Never Used  . Alcohol Use: No  . Drug Use: No  . Sexual Activity: Not on file   Other Topics Concern  . Not on file   Social History Narrative   Health Maintenance  Topic Date Due  . MAMMOGRAM  09/09/2014  . INFLUENZA VACCINE  03/31/2015  . PAP SMEAR  06/10/2015  . COLONOSCOPY  03/29/2017  . TETANUS/TDAP  07/04/2020    The following portions of the patient's history were reviewed and updated as appropriate: allergies, current medications, past family history, past medical history, past social history, past surgical history and problem list.  Review of Systems A comprehensive review of systems was negative.   Objective:   BP 128/84 mmHg  Pulse 73  Temp(Src) 97.9 F (36.6 C) (Oral)  Resp 16  Ht 5\' 3"  (1.6 m)  Wt 113 lb 12 oz (51.597 kg)  BMI 20.16 kg/m2  SpO2 95%  General Appearance:    Alert, cooperative, no distress, appears stated age  Head:    Normocephalic, without obvious abnormality, atraumatic  Eyes:    PERRL, conjunctiva/corneas clear, EOM's intact, fundi    benign, both eyes  Ears:    Normal TM's and external ear canals, both ears  Nose:   Nares normal, septum midline, mucosa normal, no drainage    or sinus tenderness  Throat:   Lips, mucosa, and tongue normal; teeth and gums normal  Neck:   Supple, symmetrical, trachea midline, no adenopathy;    thyroid:  no enlargement/tenderness/nodules; no carotid   bruit or JVD  Back:     Symmetric, no curvature, ROM normal, no CVA tenderness  Lungs:     Clear  to auscultation bilaterally, respirations unlabored  Chest Wall:    No tenderness or deformity   Heart:    Regular rate and rhythm, S1 and S2 normal, no murmur, rub   or gallop  Breast Exam:    No tenderness, masses, or nipple abnormality  Abdomen:     Soft, non-tender, bowel sounds active all four quadrants,    no masses, no organomegaly  Genitalia:    Pelvic: cervix normal in appearance, external genitalia normal, no adnexal masses or tenderness, no cervical motion tenderness, rectovaginal septum normal, uterus normal size, shape, and consistency and vagina normal without discharge  Extremities:   Extremities normal, atraumatic, no cyanosis or edema  Pulses:   2+ and symmetric all extremities  Skin:   Skin color, texture, turgor normal, no rashes or lesions  Lymph nodes:   Cervical, supraclavicular, and axillary nodes normal  Neurologic:   CNII-XII intact, normal strength, sensation and reflexes    throughout    Assessment and Plan:

## 2014-07-10 NOTE — Assessment & Plan Note (Signed)

## 2014-07-12 ENCOUNTER — Encounter: Payer: Self-pay | Admitting: Internal Medicine

## 2014-07-15 MED ORDER — ESTRADIOL 0.1 MG/GM VA CREA: 1.0000 g | TOPICAL_CREAM | Freq: Every day | VAGINAL | Status: DC

## 2014-07-23 ENCOUNTER — Ambulatory Visit: Payer: Self-pay | Admitting: Internal Medicine

## 2014-07-23 LAB — HM MAMMOGRAPHY: HM Mammogram: NEGATIVE

## 2014-07-24 ENCOUNTER — Encounter: Payer: Self-pay | Admitting: *Deleted

## 2014-07-30 ENCOUNTER — Encounter: Payer: Self-pay | Admitting: Internal Medicine

## 2014-12-25 ENCOUNTER — Other Ambulatory Visit: Payer: Self-pay | Admitting: Internal Medicine

## 2015-01-16 ENCOUNTER — Ambulatory Visit (INDEPENDENT_AMBULATORY_CARE_PROVIDER_SITE_OTHER): Payer: PRIVATE HEALTH INSURANCE | Admitting: Internal Medicine

## 2015-01-16 ENCOUNTER — Encounter: Payer: Self-pay | Admitting: Internal Medicine

## 2015-01-16 VITALS — BP 126/88 | HR 79 | Temp 98.2°F | Resp 14 | Ht 63.0 in | Wt 116.5 lb

## 2015-01-16 DIAGNOSIS — E038 Other specified hypothyroidism: Secondary | ICD-10-CM

## 2015-01-16 DIAGNOSIS — G43009 Migraine without aura, not intractable, without status migrainosus: Secondary | ICD-10-CM | POA: Diagnosis not present

## 2015-01-16 DIAGNOSIS — R5383 Other fatigue: Secondary | ICD-10-CM | POA: Insufficient documentation

## 2015-01-16 DIAGNOSIS — E559 Vitamin D deficiency, unspecified: Secondary | ICD-10-CM

## 2015-01-16 DIAGNOSIS — E034 Atrophy of thyroid (acquired): Secondary | ICD-10-CM | POA: Diagnosis not present

## 2015-01-16 DIAGNOSIS — J302 Other seasonal allergic rhinitis: Secondary | ICD-10-CM

## 2015-01-16 DIAGNOSIS — M2550 Pain in unspecified joint: Secondary | ICD-10-CM

## 2015-01-16 DIAGNOSIS — R51 Headache: Secondary | ICD-10-CM

## 2015-01-16 DIAGNOSIS — R5382 Chronic fatigue, unspecified: Secondary | ICD-10-CM

## 2015-01-16 DIAGNOSIS — R519 Headache, unspecified: Secondary | ICD-10-CM

## 2015-01-16 LAB — COMPREHENSIVE METABOLIC PANEL
ALBUMIN: 4.6 g/dL (ref 3.5–5.2)
ALT: 15 U/L (ref 0–35)
AST: 20 U/L (ref 0–37)
Alkaline Phosphatase: 59 U/L (ref 39–117)
BUN: 15 mg/dL (ref 6–23)
CALCIUM: 10 mg/dL (ref 8.4–10.5)
CHLORIDE: 99 meq/L (ref 96–112)
CO2: 30 meq/L (ref 19–32)
Creatinine, Ser: 0.67 mg/dL (ref 0.40–1.20)
GFR: 96.02 mL/min (ref >60.00)
Glucose, Bld: 72 mg/dL (ref 70–99)
POTASSIUM: 4.1 meq/L (ref 3.5–5.1)
SODIUM: 135 meq/L (ref 135–145)
TOTAL PROTEIN: 7.1 g/dL (ref 6.0–8.3)
Total Bilirubin: 0.6 mg/dL (ref 0.2–1.2)

## 2015-01-16 LAB — CBC WITH DIFFERENTIAL/PLATELET
BASOS ABS: 0 10*3/uL (ref 0.0–0.1)
Basophils Relative: 0.5 % (ref 0.0–3.0)
EOS ABS: 0 10*3/uL (ref 0.0–0.7)
Eosinophils Relative: 0.9 % (ref 0.0–5.0)
HEMATOCRIT: 42.8 % (ref 36.0–46.0)
Hemoglobin: 14.7 g/dL (ref 12.0–15.0)
Lymphocytes Relative: 17.4 % (ref 12.0–46.0)
Lymphs Abs: 0.9 10*3/uL (ref 0.7–4.0)
MCHC: 34.4 g/dL (ref 30.0–36.0)
MCV: 98.2 fl (ref 78.0–100.0)
Monocytes Absolute: 0.4 10*3/uL (ref 0.1–1.0)
Monocytes Relative: 8 % (ref 3.0–12.0)
NEUTROS PCT: 73.2 % (ref 43.0–77.0)
Neutro Abs: 3.6 10*3/uL (ref 1.4–7.7)
PLATELETS: 215 10*3/uL (ref 150.0–400.0)
RBC: 4.36 Mil/uL (ref 3.87–5.11)
RDW: 12.6 % (ref 11.5–15.5)
WBC: 4.9 10*3/uL (ref 4.0–10.5)

## 2015-01-16 LAB — SEDIMENTATION RATE: SED RATE: 7 mm/h (ref 0–22)

## 2015-01-16 LAB — VITAMIN D 25 HYDROXY (VIT D DEFICIENCY, FRACTURES): VITD: 42.33 ng/mL (ref 30.00–100.00)

## 2015-01-16 LAB — C-REACTIVE PROTEIN: CRP: 0.1 mg/dL — ABNORMAL LOW (ref 0.5–20.0)

## 2015-01-16 LAB — URIC ACID: URIC ACID, SERUM: 2.1 mg/dL — AB (ref 2.4–7.0)

## 2015-01-16 LAB — TSH: TSH: 1.79 u[IU]/mL (ref 0.35–4.50)

## 2015-01-16 MED ORDER — MONTELUKAST SODIUM 10 MG PO TABS: 10.0000 mg | ORAL_TABLET | Freq: Every day | ORAL | Status: DC

## 2015-01-16 MED ORDER — SUMATRIPTAN SUCCINATE 100 MG PO TABS: ORAL_TABLET | ORAL | Status: DC

## 2015-01-16 NOTE — Progress Notes (Signed)
Subjective:  Patient ID: Caitlyn Gregory, female    DOB: [birth year]  Age: 59 y.o. MRN: [id number]  CC:   Cold hands and feet  2) joint stiffness  HPI [name] presents for evaluation of several issues.  She reports persistent fatigue,  Foggy thinking.  Feels that she is not as sharp intellectually by the end of the day at work.  Still feels she is smarter than all of her colleagues, and continues to be frustrated  By their lack of perfectionism.  Happy at home,  And her daughter Caitlyn Gregory is no longer a source of constnat worry as she is doing very well currently under the care of Dr Maryruth Bun. 2)   Allergies acting up in spite of daily allegra . Headaches have now become daily for the past 2 months,  Using excedrin on a daily basis  2 to 3 excedrin daily . 3) joints feel stiff a lot, even after she works out. Epsiodes last forseveral weeks then resolve,  No change in workout.     Outpatient Prescriptions Prior to Visit  Medication Sig Dispense Refill  . Aspirin-Acetaminophen-Caffeine (EXCEDRIN PO) Take by mouth daily as needed     . calcipotriene-betamethasone (TACLONEX) ointment Apply 1 application topically daily.    . Cholecalciferol (VITAMIN D) 2000 UNITS CAPS Take one by mouth daily     . levothyroxine (SYNTHROID) 75 MCG tablet TAKE 1 TABLET BY MOUTH EVERY DAY 90 tablet 3  . SUMAtriptan (IMITREX) 100 MG tablet ONE-HALF TABLET AT ONSET THEN 1 TAB IN 2HRS AS NEEDED 9 tablet 2  . estradiol (ESTRACE) 0.1 MG/GM vaginal cream Place 0.25 Applicatorfuls vaginally daily. For two weeks,  Then twice weekly thereafter (Patient not taking: Reported on 01/16/2015) 42.5 g 5  . metroNIDAZOLE (FLAGYL) 500 MG tablet Take 1 tablet (500 mg total) by mouth 2 (two) times daily. (Patient not taking: Reported on 01/16/2015) 14 tablet 0  . Nitrofurantoin Monohyd Macro (MACROBID PO) Take 1 capsule by mouth 2 (two) times daily.     No facility-administered medications prior to visit.    Review of Systems;  Patient  denies headache, fevers, malaise, unintentional weight loss, skin rash, eye pain, sinus congestion and sinus pain, sore throat, dysphagia,  hemoptysis , cough, dyspnea, wheezing, chest pain, palpitations, orthopnea, edema, abdominal pain, nausea, melena, diarrhea, constipation, flank pain, dysuria, hematuria, urinary  Frequency, nocturia, numbness, tingling, seizures,  Focal weakness, Loss of consciousness,  Tremor, insomnia, depression, anxiety, and suicidal ideation.      Objective:  BP 126/88 mmHg  Pulse 79  Temp(Src) 98.2 F (36.8 C) (Oral)  Resp 14  Ht  (1.6 m)  Wt 116 lb 8 oz (52.844 kg)  BMI 20.64 kg/m2  SpO2 99%  BP Readings from Last 3 Encounters:  01/16/15 126/88  07/09/14 128/84  02/28/14 126/74    Wt Readings from Last 3 Encounters:  01/16/15 116 lb 8 oz (52.844 kg)  07/09/14 113 lb 12 oz (51.597 kg)  02/28/14 116 lb 8 oz (52.844 kg)    General appearance: alert, cooperative and appears stated age Ears: normal TM's and external ear canals both ears Throat: lips, mucosa, and tongue normal; teeth and gums normal Neck: no adenopathy, no carotid bruit, supple, symmetrical, trachea midline and thyroid not enlarged, symmetric, no tenderness/mass/nodules Back: symmetric, no curvature. ROM normal. No CVA tenderness. Lungs: clear to auscultation bilaterally Heart: regular rate and rhythm, S1, S2 normal, no murmur, click, rub or gallop Abdomen: soft, non-tender; bowel sounds normal;  no masses,  no organomegaly Pulses: 2+ and symmetric Skin: Skin color, texture, turgor normal. No rashes or lesions Lymph nodes: Cervical, supraclavicular, and axillary nodes normal.  Lab Results  Component Value Date   HGBA1C 4.9 12/28/2013   HGBA1C 5.2 03/28/2012    Lab Results  Component Value Date   CREATININE 0.67 01/16/2015   CREATININE 0.8 12/25/2013   CREATININE 0.7 07/24/2013    Lab Results  Component Value Date   WBC 4.9 01/16/2015   HGB 14.7 01/16/2015   HCT 42.8  01/16/2015   PLT 215.0 01/16/2015   GLUCOSE 72 01/16/2015   CHOL 187 12/25/2013   TRIG 55.0 12/25/2013   HDL 84.10 12/25/2013   LDLCALC 92 12/25/2013   ALT 15 01/16/2015   AST 20 01/16/2015   NA 135 01/16/2015   K 4.1 01/16/2015   CL 99 01/16/2015   CREATININE 0.67 01/16/2015   BUN 15 01/16/2015   CO2 30 01/16/2015   TSH 1.79 01/16/2015   HGBA1C 4.9 12/28/2013    No results found.  Assessment & Plan:   Problem List Items Addressed This Visit    Hypothyroidism    Thyroid function is WNL on current dose.  No current changes needed.   Lab Results  Component Value Date   TSH 1.79 01/16/2015         Relevant Orders   TSH (Completed)   Rhinitis, allergic - Primary    Adding singulair to allegra ,. Prior trial of steroid nasal spray started by ENT caused worsening of migraines       Chronic daily headache    Patient very unwilling to give up excedrin to break the cycle,  Considers the daily headaches to be due to inadequately treated sinus congestion from allergies .  Trial of adding singulair.  Discussed plan to try low dose metoprolol XL at bedtime inf addition of singulair does not help.  Should also consider MBI of brain if daily headaches persistent since this is a new pattern.        Relevant Medications   SUMAtriptan (IMITREX) 100 MG tablet   Pain in joint, multiple sites    She is the right age for PMR, and is physically extremely active, but serologies today are completely normal,  Suggesting OA perhaps aggravated by untreated depression.    Lab Results  Component Value Date   ESRSEDRATE 7 01/16/2015   Lab Results  Component Value Date   CRP 0.1* 01/16/2015   Lab Results  Component Value Date   RF <10 01/16/2015         Relevant Orders   Sedimentation rate (Completed)   C-reactive protein (Completed)   Cyclic citrul peptide antibody, IgG (Completed)   Uric acid (Completed)   Iron and TIBC (Completed)   Rheumatoid factor (Completed)   Fatigue     Relevant Orders   Comprehensive metabolic panel (Completed)   CBC with Differential/Platelet (Completed)   Hypovitaminosis D   Relevant Orders   Vit D  25 hydroxy (rtn osteoporosis monitoring) (Completed)   Migraine headache   Relevant Medications   SUMAtriptan (IMITREX) 100 MG tablet      I have discontinued Ms. Cuartas's Nitrofurantoin Monohyd Macro (MACROBID PO), metroNIDAZOLE, and estradiol. I am also having her start on montelukast. Additionally, I am having her maintain her Vitamin D, Aspirin-Acetaminophen-Caffeine (EXCEDRIN PO), calcipotriene-betamethasone, levothyroxine, and SUMAtriptan.  Meds ordered this encounter  Medications  . SUMAtriptan (IMITREX) 100 MG tablet    Sig: ONE-HALF TABLET AT ONSET THEN 1  TAB IN 2HRS AS NEEDED    Dispense:  9 tablet    Refill:  2  . montelukast (SINGULAIR) 10 MG tablet    Sig: Take 1 tablet (10 mg total) by mouth at bedtime.    Dispense:  30 tablet    Refill:  3    Medications Discontinued During This Encounter  Medication Reason  . metroNIDAZOLE (FLAGYL) 500 MG tablet Completed Course  . Nitrofurantoin Monohyd Macro (MACROBID PO) Completed Course  . estradiol (ESTRACE) 0.1 MG/GM vaginal cream Patient Preference  . SUMAtriptan (IMITREX) 100 MG tablet Reorder  . estradiol (ESTRACE) 0.1 MG/GM vaginal cream Patient Preference  . metroNIDAZOLE (FLAGYL) 500 MG tablet Completed Course   A total of 40 minutes was spent with patient more than half of which was spent in counseling patient on the above mentioned issues , reviewing and explaining recent labs and imaging studies done, and coordination of care.  Follow-up: Return in about 3 months (around 04/18/2015).   Sherlene ShamsULLO, Juno Bozard L, MD

## 2015-01-16 NOTE — Patient Instructions (Signed)
Please try the singulair daily for two weeks as a complement to the allegra for your allergies  If the headaches are not improved in frequency, in 2 weeks,  We will consider a tiral fo low dose metoprolol at bedtime and a weaning of the excedrin daily dosing

## 2015-01-16 NOTE — Progress Notes (Deleted)
Pre-visit discussion using our clinic review tool. No additional management support is needed unless otherwise documented below in the visit note.  

## 2015-01-16 NOTE — Assessment & Plan Note (Addendum)
Adding singulair to allegra ,. Prior trial of steroid nasal spray started by ENT caused worsening of migraines

## 2015-01-17 LAB — IRON AND TIBC
%SAT: 38 % (ref 20–55)
Iron: 124 ug/dL (ref 42–145)
TIBC: 325 ug/dL (ref 250–470)
UIBC: 201 ug/dL (ref 125–400)

## 2015-01-17 LAB — RHEUMATOID FACTOR: Rhuematoid fact SerPl-aCnc: <10 IU/mL (ref <=14)

## 2015-01-18 NOTE — Assessment & Plan Note (Signed)
She is the right age for PMR, and is physically extremely active, but serologies today are completely normal,  Suggesting OA perhaps aggravated by untreated depression.    Lab Results  Component Value Date   ESRSEDRATE 7 01/16/2015   Lab Results  Component Value Date   CRP 0.1* 01/16/2015   Lab Results  Component Value Date   RF <10 01/16/2015

## 2015-01-18 NOTE — Assessment & Plan Note (Signed)
Thyroid function is WNL on current dose.  No current changes needed.   Lab Results  Component Value Date   TSH 1.79 01/16/2015

## 2015-01-18 NOTE — Assessment & Plan Note (Signed)
Patient very unwilling to give up excedrin to break the cycle,  Considers the daily headaches to be due to inadequately treated sinus congestion from allergies .  Trial of adding singulair.  Discussed plan to try low dose metoprolol XL at bedtime inf addition of singulair does not help.  Should also consider MBI of brain if daily headaches persistent since this is a new pattern.

## 2015-01-20 ENCOUNTER — Encounter: Payer: Self-pay | Admitting: Internal Medicine

## 2015-01-20 LAB — CYCLIC CITRUL PEPTIDE ANTIBODY, IGG: Cyclic Citrullin Peptide Ab: <2 U/mL (ref 0.0–5.0)

## 2015-04-21 ENCOUNTER — Ambulatory Visit: Payer: PRIVATE HEALTH INSURANCE | Admitting: Internal Medicine

## 2015-05-07 ENCOUNTER — Other Ambulatory Visit: Payer: Self-pay | Admitting: Internal Medicine

## 2015-06-10 ENCOUNTER — Other Ambulatory Visit: Payer: Self-pay | Admitting: Internal Medicine

## 2015-07-14 ENCOUNTER — Encounter: Payer: PRIVATE HEALTH INSURANCE | Admitting: Internal Medicine

## 2015-07-31 ENCOUNTER — Encounter: Payer: PRIVATE HEALTH INSURANCE | Admitting: Internal Medicine

## 2015-08-08 ENCOUNTER — Encounter: Payer: Self-pay | Admitting: Internal Medicine

## 2015-08-08 ENCOUNTER — Ambulatory Visit (INDEPENDENT_AMBULATORY_CARE_PROVIDER_SITE_OTHER): Payer: Managed Care, Other (non HMO) | Admitting: Internal Medicine

## 2015-08-08 VITALS — BP 118/78 | HR 81 | Temp 98.2°F | Resp 12 | Ht 63.0 in | Wt 117.2 lb

## 2015-08-08 DIAGNOSIS — Z800 Family history of malignant neoplasm of digestive organs: Secondary | ICD-10-CM | POA: Insufficient documentation

## 2015-08-08 DIAGNOSIS — Z0000 Encounter for general adult medical examination without abnormal findings: Secondary | ICD-10-CM | POA: Diagnosis not present

## 2015-08-08 DIAGNOSIS — Z1239 Encounter for other screening for malignant neoplasm of breast: Secondary | ICD-10-CM

## 2015-08-08 NOTE — Assessment & Plan Note (Signed)
Her cousin had colon ca.  She was advised to have 5 yr follow up colonoscopy after a normal one in 2008 but declined.  Will initiate referral for Cologuard.

## 2015-08-08 NOTE — Progress Notes (Signed)
Patient ID: Caitlyn Gregory, female    DOB: 06/17/1956  Age: 59 y.o. MRN: [id number]  The patient is here for annual preventive /wellness examination and management of other chronic and acute problems.   PAP normal nov 2015  Mammogram  Normal nov 2015 ext dense breasts Colonoscopy normal 2008 [facility]?    The risk factors are reflected in the social history.  The roster of all physicians providing medical care to patient - is listed in the Snapshot section of the chart.   Home safety : The patient has smoke detectors in the home. They wear seatbelts.  There are no firearms at home. There is no violence in the home.   There is no risks for hepatitis, STDs or HIV. There is no   history of blood transfusion. They have no travel history to infectious disease endemic areas of the world.  The patient has seen their dentist in the last six month. They have seen their eye doctor in the last year. They admit to slight hearing difficulty with regard to whispered voices and some television programs.  They have deferred audiologic testing in the last year.  They do not  have excessive sun exposure. Discussed the need for sun protection: hats, long sleeves and use of sunscreen if there is significant sun exposure.   Diet: the importance of a healthy diet is discussed. They do have a healthy diet.  The benefits of regular aerobic exercise were discussed. She works our rigorously 5 days per week  For 60 minutes.   Depression screen: there are no signs or vegative symptoms of depression- irritability, change in appetite, anhedonia, sadness/tearfullness.  The following portions of the patient's history were reviewed and updated as appropriate: allergies, current medications, past family history, past medical history,  past surgical history, past social history  and problem list.  Visual acuity was not assessed per patient preference since she has regular follow up with her ophthalmologist. Hearing and body mass  index were assessed and reviewed.   During the course of the visit the patient was educated and counseled about appropriate screening and preventive services including : fall prevention , diabetes screening, nutrition counseling, colorectal cancer screening, and recommended immunizations.    CC: The primary encounter diagnosis was Breast cancer screening. Diagnoses of Family history of colon cancer requiring screening colonoscopy and Visit for preventive health examination were also pertinent to this visit.  History Caitlyn Gregory has a past medical history of History of migraine headaches; Thyroid disease; Asthma; Allergy; Fibrocystic breast disease; Anemia; Vitamin D deficiency; and Hyperlipidemia.   She has past surgical history that includes chin implant (1980s); Appendectomy (1967); Frontalis suspension (2008); and Hernia repair (2008).   Her family history includes Alcohol abuse in her brother; Cancer in her mother; Heart disease (age of onset: 3944) in her father; Osteoporosis in her mother; Stroke (age of onset: 3788) in her mother.She reports that she has never smoked. She has never used smokeless tobacco. She reports that she does not drink alcohol or use illicit drugs.  Outpatient Prescriptions Prior to Visit  Medication Sig Dispense Refill  . Aspirin-Acetaminophen-Caffeine (EXCEDRIN PO) Take by mouth daily as needed     . calcipotriene-betamethasone (TACLONEX) ointment Apply 1 application topically daily.    . Cholecalciferol (VITAMIN D) 2000 UNITS CAPS Take one by mouth daily     . SUMAtriptan (IMITREX) 100 MG tablet ONE-HALF TABLET AT ONSET THEN 1 TAB IN 2HRS AS NEEDED 9 tablet 2  . SUMAtriptan (IMITREX) 100 MG  tablet TAKE 1/2 (ONE-HALF) TABLET BY MOUTH AT THE ONSET, THEN 1 TABLET IN 2 HOURS AS NEEDED 9 tablet 6  . SYNTHROID 75 MCG tablet TAKE ONE TABLET BY MOUTH EVERY DAY 90 tablet 3  . montelukast (SINGULAIR) 10 MG tablet Take 1 tablet (10 mg total) by mouth at bedtime. (Patient not taking:  Reported on 08/08/2015) 30 tablet 3   No facility-administered medications prior to visit.    Review of Systems   Patient denies headache, fevers, malaise, unintentional weight loss, skin rash, eye pain, sinus congestion and sinus pain, sore throat, dysphagia,  hemoptysis , cough, dyspnea, wheezing, chest pain, palpitations, orthopnea, edema, abdominal pain, nausea, melena, diarrhea, constipation, flank pain, dysuria, hematuria, urinary  Frequency, nocturia, numbness, tingling, seizures,  Focal weakness, Loss of consciousness,  Tremor, insomnia, depression, anxiety, and suicidal ideation.      Objective:  BP 118/78 mmHg  Pulse 81  Temp(Src) 98.2 F (36.8 C) (Oral)  Resp 12  Ht  (1.6 m)  Wt 117 lb 4 oz (53.184 kg)  BMI 20.78 kg/m2  SpO2 99%  Physical Exam   General appearance: alert, cooperative and appears stated age Head: Normocephalic, without obvious abnormality, atraumatic Eyes: conjunctivae/corneas clear. PERRL, EOM's intact. Fundi benign. Ears: normal TM's and external ear canals both ears Nose: Nares normal. Septum midline. Mucosa normal. No drainage or sinus tenderness. Throat: lips, mucosa, and tongue normal; teeth and gums normal Neck: no adenopathy, no carotid bruit, no JVD, supple, symmetrical, trachea midline and thyroid not enlarged, symmetric, no tenderness/mass/nodules Lungs: clear to auscultation bilaterally Breasts: normal appearance, no masses or tenderness Heart: regular rate and rhythm, S1, S2 normal, no murmur, click, rub or gallop Abdomen: soft, non-tender; bowel sounds normal; no masses,  no organomegaly Extremities: extremities normal, atraumatic, no cyanosis or edema Pulses: 2+ and symmetric Skin: Skin color, texture, turgor normal. No rashes or lesions Neurologic: Alert and oriented X 3, normal strength and tone. Normal symmetric reflexes. Normal coordination and gait.     Assessment & Plan:   Problem List Items Addressed This Visit    Visit  for preventive health examination    Annual comprehensive preventive exam was done as well as an evaluation and management of chronic conditions .  During the course of the visit the patient was educated and counseled about appropriate screening and preventive services including :  diabetes screening, lipid analysis with projected  10 year  risk for CAD  , nutrition counseling, colorectal cancer screening, and recommended immunizations.  Printed recommendations for health maintenance screenings was given.       Family history of colon cancer requiring screening colonoscopy    Her cousin had colon ca.  She was advised to have 5 yr follow up colonoscopy after a normal one in 2008 but declined.  Will initiate referral for Cologuard.        Other Visit Diagnoses    Breast cancer screening    -  Primary    Relevant Orders    MM DIGITAL SCREENING BILATERAL       I am having Ms. Allocca maintain her Vitamin D, Aspirin-Acetaminophen-Caffeine (EXCEDRIN PO), calcipotriene-betamethasone, SUMAtriptan, montelukast, SUMAtriptan, and SYNTHROID.  No orders of the defined types were placed in this encounter.    There are no discontinued medications.  Follow-up: No Follow-up on file.   Sherlene Shams, MD

## 2015-08-08 NOTE — Progress Notes (Signed)
Pre-visit discussion using our clinic review tool. No additional management support is needed unless otherwise documented below in the visit note.  

## 2015-08-08 NOTE — Patient Instructions (Addendum)
I agree that a trial of  Nasal Crom trial may help ,  I also recommend once daily use of Neil Meds sinus rinse   Save  The prednisone taper for day 4 or 5 if you are  still very  congested   We'll get your Cologuard and Mammogram ordered   I do recommend every other year dermatology evals at the minimum   Menopause is a normal process in which your reproductive ability comes to an end. This process happens gradually over a span of months to years, usually between the ages of 54 and 24. Menopause is complete when you have missed 12 consecutive menstrual periods. It is important to talk with your health care provider about some of the most common conditions that affect postmenopausal women, such as heart disease, cancer, and bone loss (osteoporosis). Adopting a healthy lifestyle and getting preventive care can help to promote your health and wellness. Those actions can also lower your chances of developing some of these common conditions. WHAT SHOULD I KNOW ABOUT MENOPAUSE? During menopause, you may experience a number of symptoms, such as:  Moderate-to-severe hot flashes.  Night sweats.  Decrease in sex drive.  Mood swings.  Headaches.  Tiredness.  Irritability.  Memory problems.  Insomnia. Choosing to treat or not to treat menopausal changes is an individual decision that you make with your health care provider. WHAT SHOULD I KNOW ABOUT HORMONE REPLACEMENT THERAPY AND SUPPLEMENTS? Hormone therapy products are effective for treating symptoms that are associated with menopause, such as hot flashes and night sweats. Hormone replacement carries certain risks, especially as you become older. If you are thinking about using estrogen or estrogen with progestin treatments, discuss the benefits and risks with your health care provider. WHAT SHOULD I KNOW ABOUT HEART DISEASE AND STROKE? Heart disease, heart attack, and stroke become more likely as you age. This may be due, in part, to the  hormonal changes that your body experiences during menopause. These can affect how your body processes dietary fats, triglycerides, and cholesterol. Heart attack and stroke are both medical emergencies. There are many things that you can do to help prevent heart disease and stroke:  Have your blood pressure checked at least every 1-2 years. High blood pressure causes heart disease and increases the risk of stroke.  If you are 33-64 years old, ask your health care provider if you should take aspirin to prevent a heart attack or a stroke.  Do not use any tobacco products, including cigarettes, chewing tobacco, or electronic cigarettes. If you need help quitting, ask your health care provider.  It is important to eat a healthy diet and maintain a healthy weight.  Be sure to include plenty of vegetables, fruits, low-fat dairy products, and lean protein.  Avoid eating foods that are high in solid fats, added sugars, or salt (sodium).  Get regular exercise. This is one of the most important things that you can do for your health.  Try to exercise for at least 150 minutes each week. The type of exercise that you do should increase your heart rate and make you sweat. This is known as moderate-intensity exercise.  Try to do strengthening exercises at least twice each week. Do these in addition to the moderate-intensity exercise.  Know your numbers.Ask your health care provider to check your cholesterol and your blood glucose. Continue to have your blood tested as directed by your health care provider. WHAT SHOULD I KNOW ABOUT CANCER SCREENING? There are several types of  cancer. Take the following steps to reduce your risk and to catch any cancer development as early as possible. Breast Cancer  Practice breast self-awareness.  This means understanding how your breasts normally appear and feel.  It also means doing regular breast self-exams. Let your health care provider know about any changes,  no matter how small.  If you are 76 or older, have a clinician do a breast exam (clinical breast exam or CBE) every year. Depending on your age, family history, and medical history, it may be recommended that you also have a yearly breast X-ray (mammogram).  If you have a family history of breast cancer, talk with your health care provider about genetic screening.  If you are at high risk for breast cancer, talk with your health care provider about having an MRI and a mammogram every year.  Breast cancer (BRCA) gene test is recommended for women who have family members with BRCA-related cancers. Results of the assessment will determine the need for genetic counseling and BRCA1 and for BRCA2 testing. BRCA-related cancers include these types:  Breast. This occurs in males or females.  Ovarian.  Tubal. This may also be called fallopian tube cancer.  Cancer of the abdominal or pelvic lining (peritoneal cancer).  Prostate.  Pancreatic. Cervical, Uterine, and Ovarian Cancer Your health care provider may recommend that you be screened regularly for cancer of the pelvic organs. These include your ovaries, uterus, and vagina. This screening involves a pelvic exam, which includes checking for microscopic changes to the surface of your cervix (Pap test).  For women ages 21-65, health care providers may recommend a pelvic exam and a Pap test every three years. For women ages 40-65, they may recommend the Pap test and pelvic exam, combined with testing for human papilloma virus (HPV), every five years. Some types of HPV increase your risk of cervical cancer. Testing for HPV may also be done on women of any age who have unclear Pap test results.  Other health care providers may not recommend any screening for nonpregnant women who are considered low risk for pelvic cancer and have no symptoms. Ask your health care provider if a screening pelvic exam is right for you.  If you have had past treatment for  cervical cancer or a condition that could lead to cancer, you need Pap tests and screening for cancer for at least 20 years after your treatment. If Pap tests have been discontinued for you, your risk factors (such as having a new sexual partner) need to be reassessed to determine if you should start having screenings again. Some women have medical problems that increase the chance of getting cervical cancer. In these cases, your health care provider may recommend that you have screening and Pap tests more often.  If you have a family history of uterine cancer or ovarian cancer, talk with your health care provider about genetic screening.  If you have vaginal bleeding after reaching menopause, tell your health care provider.  There are currently no reliable tests available to screen for ovarian cancer. Lung Cancer Lung cancer screening is recommended for adults 98-37 years old who are at high risk for lung cancer because of a history of smoking. A yearly low-dose CT scan of the lungs is recommended if you:  Currently smoke.  Have a history of at least 30 pack-years of smoking and you currently smoke or have quit within the past 15 years. A pack-year is smoking an average of one pack of cigarettes per  day for one year. Yearly screening should:  Continue until it has been 15 years since you quit.  Stop if you develop a health problem that would prevent you from having lung cancer treatment. Colorectal Cancer  This type of cancer can be detected and can often be prevented.  Routine colorectal cancer screening usually begins at age 39 and continues through age 64.  If you have risk factors for colon cancer, your health care provider may recommend that you be screened at an earlier age.  If you have a family history of colorectal cancer, talk with your health care provider about genetic screening.  Your health care provider may also recommend using home test kits to check for hidden blood in  your stool.  A small camera at the end of a tube can be used to examine your colon directly (sigmoidoscopy or colonoscopy). This is done to check for the earliest forms of colorectal cancer.  Direct examination of the colon should be repeated every 5-10 years until age 44. However, if early forms of precancerous polyps or small growths are found or if you have a family history or genetic risk for colorectal cancer, you may need to be screened more often. Skin Cancer  Check your skin from head to toe regularly.  Monitor any moles. Be sure to tell your health care provider:  About any new moles or changes in moles, especially if there is a change in a mole's shape or color.  If you have a mole that is larger than the size of a pencil eraser.  If any of your family members has a history of skin cancer, especially at a young age, talk with your health care provider about genetic screening.  Always use sunscreen. Apply sunscreen liberally and repeatedly throughout the day.  Whenever you are outside, protect yourself by wearing long sleeves, pants, a wide-brimmed hat, and sunglasses. WHAT SHOULD I KNOW ABOUT OSTEOPOROSIS? Osteoporosis is a condition in which bone destruction happens more quickly than new bone creation. After menopause, you may be at an increased risk for osteoporosis. To help prevent osteoporosis or the bone fractures that can happen because of osteoporosis, the following is recommended:  If you are 51-83 years old, get at least 1,000 mg of calcium and at least 600 mg of vitamin D per day.  If you are older than age 69 but younger than age 16, get at least 1,200 mg of calcium and at least 600 mg of vitamin D per day.  If you are older than age 33, get at least 1,200 mg of calcium and at least 800 mg of vitamin D per day. Smoking and excessive alcohol intake increase the risk of osteoporosis. Eat foods that are rich in calcium and vitamin D, and do weight-bearing exercises  several times each week as directed by your health care provider. WHAT SHOULD I KNOW ABOUT HOW MENOPAUSE AFFECTS [place]? Depression may occur at any age, but it is more common as you become older. Common symptoms of depression include:  Low or sad mood.  Changes in sleep patterns.  Changes in appetite or eating patterns.  Feeling an overall lack of motivation or enjoyment of activities that you previously enjoyed.  Frequent crying spells. Talk with your health care provider if you think that you are experiencing depression. WHAT SHOULD I KNOW ABOUT IMMUNIZATIONS? It is important that you get and maintain your immunizations. These include:  Tetanus, diphtheria, and pertussis (Tdap) booster vaccine.  Influenza every  year before the flu season begins.  Pneumonia vaccine.  Shingles vaccine. Your health care provider may also recommend other immunizations.   This information is not intended to replace advice given to you by your health care provider. Make sure you discuss any questions you have with your health care provider.   Document Released: 10/08/2005 Document Revised: 09/06/2014 Document Reviewed: 04/18/2014 Elsevier Interactive Patient Education [employer].

## 2015-08-10 NOTE — Assessment & Plan Note (Signed)
Annual comprehensive preventive exam was done as well as an evaluation and management of chronic conditions .  During the course of the visit the patient was educated and counseled about appropriate screening and preventive services including :  diabetes screening, lipid analysis with projected  10 year  risk for CAD , nutrition counseling, colorectal cancer screening, and recommended immunizations.  Printed recommendations for health maintenance screenings was given.   

## 2015-09-08 ENCOUNTER — Ambulatory Visit
Admission: RE | Admit: 2015-09-08 | Discharge: 2015-09-08 | Disposition: A | Discharge status: Home / Self Care | Payer: Managed Care, Other (non HMO) | Source: Ambulatory Visit | Attending: Internal Medicine | Admitting: Internal Medicine

## 2015-09-08 DIAGNOSIS — Z1239 Encounter for other screening for malignant neoplasm of breast: Secondary | ICD-10-CM

## 2015-09-11 ENCOUNTER — Other Ambulatory Visit: Payer: Self-pay | Admitting: Internal Medicine

## 2015-09-11 ENCOUNTER — Ambulatory Visit
Admission: RE | Admit: 2015-09-11 | Discharge: 2015-09-11 | Disposition: A | Discharge status: Home / Self Care | Payer: Managed Care, Other (non HMO) | Source: Ambulatory Visit | Attending: Internal Medicine | Admitting: Internal Medicine

## 2015-09-11 DIAGNOSIS — Z1231 Encounter for screening mammogram for malignant neoplasm of breast: Secondary | ICD-10-CM | POA: Diagnosis present

## 2015-09-11 DIAGNOSIS — Z1239 Encounter for other screening for malignant neoplasm of breast: Secondary | ICD-10-CM

## 2015-09-14 ENCOUNTER — Encounter: Payer: Self-pay | Admitting: Internal Medicine

## 2015-09-16 LAB — COLOGUARD: Cologuard: NEGATIVE

## 2015-11-14 ENCOUNTER — Encounter: Payer: Self-pay | Admitting: Internal Medicine

## 2015-11-19 ENCOUNTER — Encounter: Payer: Self-pay | Admitting: Internal Medicine

## 2015-12-08 ENCOUNTER — Other Ambulatory Visit: Payer: Self-pay | Admitting: Internal Medicine

## 2015-12-09 NOTE — Telephone Encounter (Signed)
Rx refill sent to pharmacy. 

## 2015-12-17 ENCOUNTER — Encounter: Payer: Self-pay | Admitting: Internal Medicine

## 2016-02-02 ENCOUNTER — Other Ambulatory Visit: Payer: Self-pay | Admitting: Internal Medicine

## 2016-03-29 IMAGING — MG MM DIGITAL SCREENING BILAT W/ TOMO W/ CAD
9 of 12 series · 9 of 28 positions shown · non-contrast
Comparison: Previous exam(s).

CLINICAL DATA: Screening.

EXAM:
DIGITAL SCREENING BILATERAL MAMMOGRAM WITH 3D TOMO WITH CAD

[L MLO synth-2D]
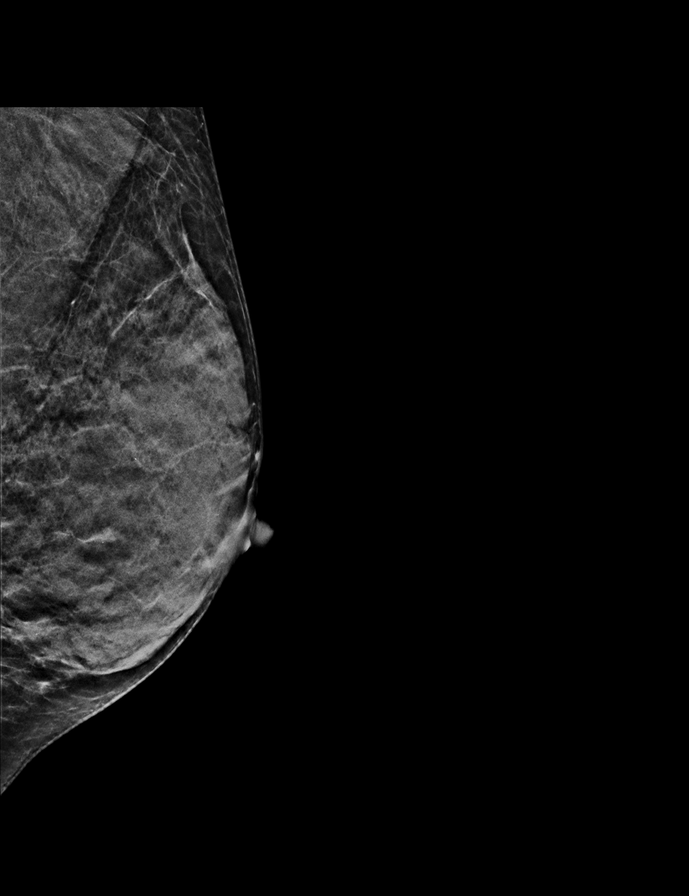

[R MLO]
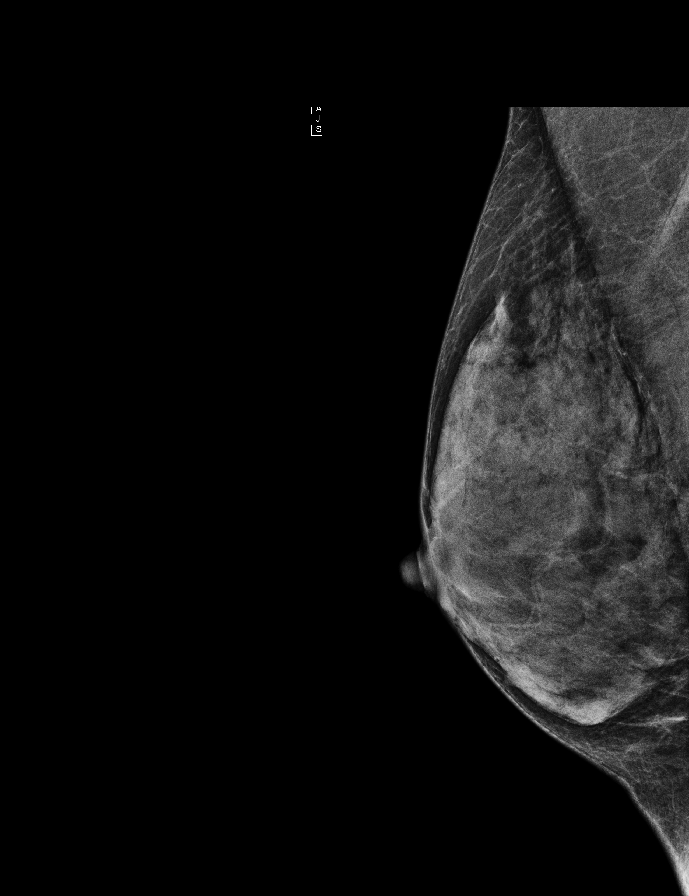

[R MLO synth-2D]
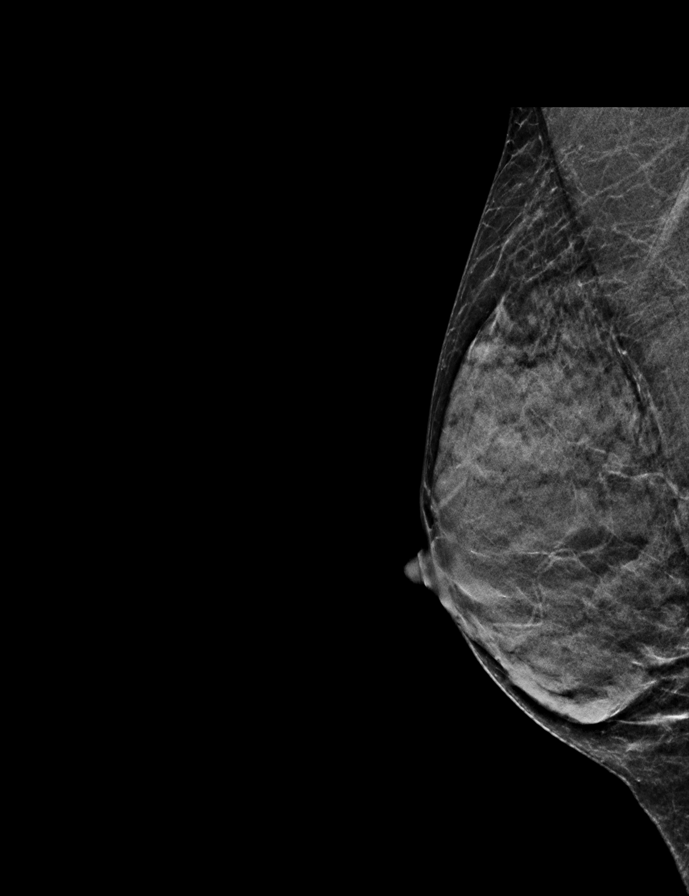

[L CC synth-2D]
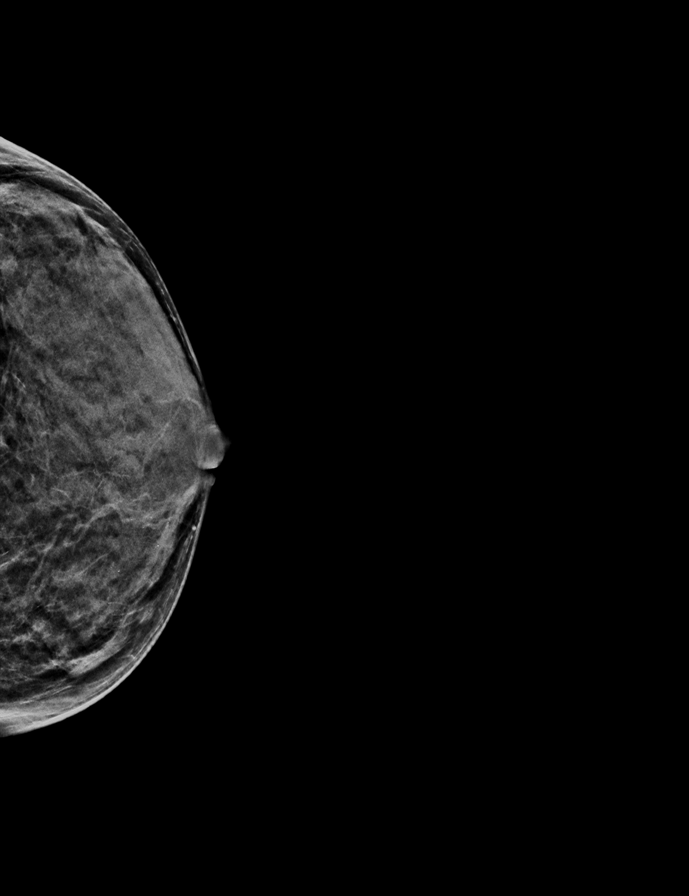

[R CC]
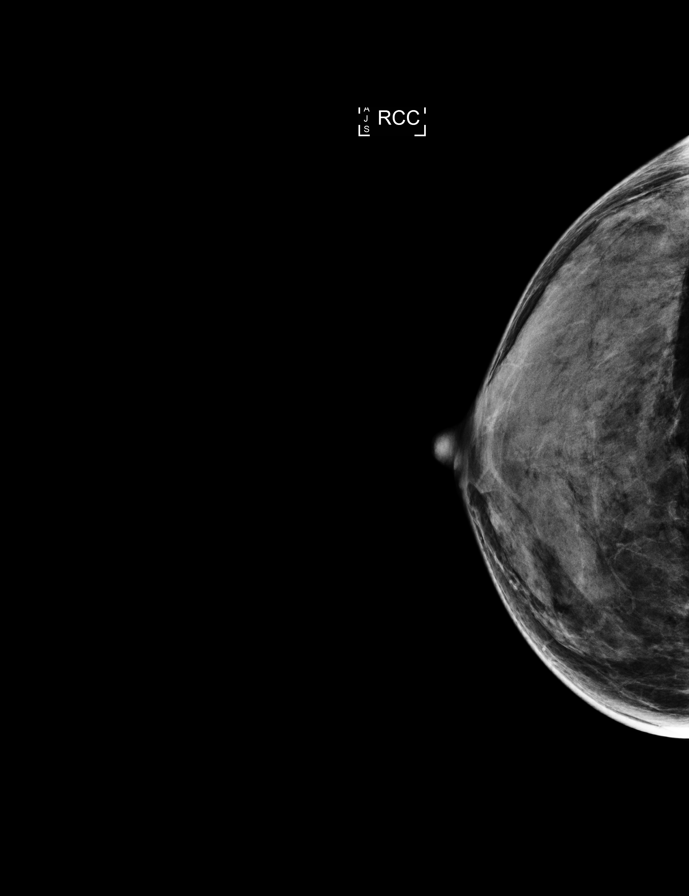

[L MLO]
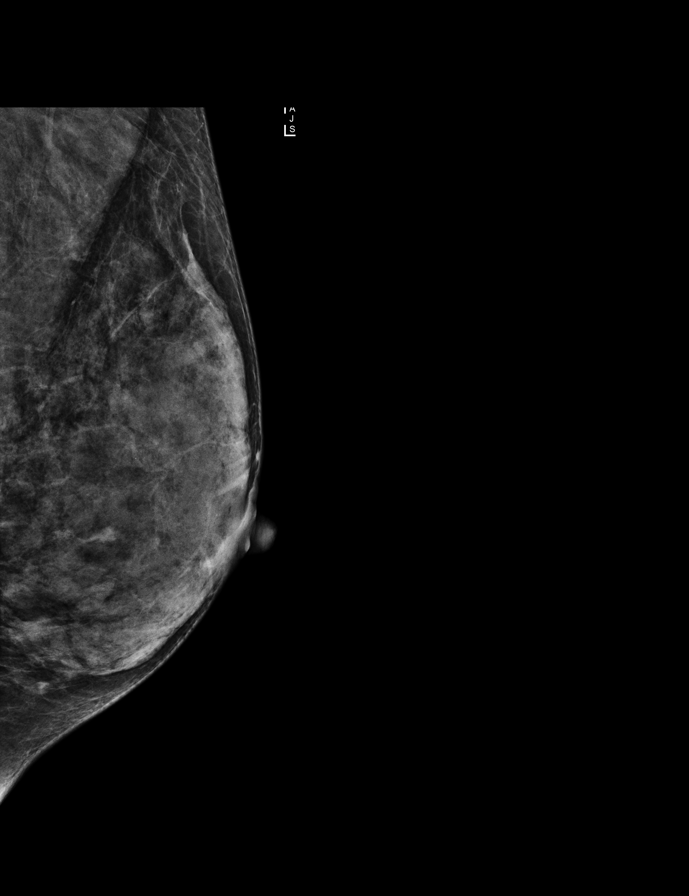

[R CC synth-2D]
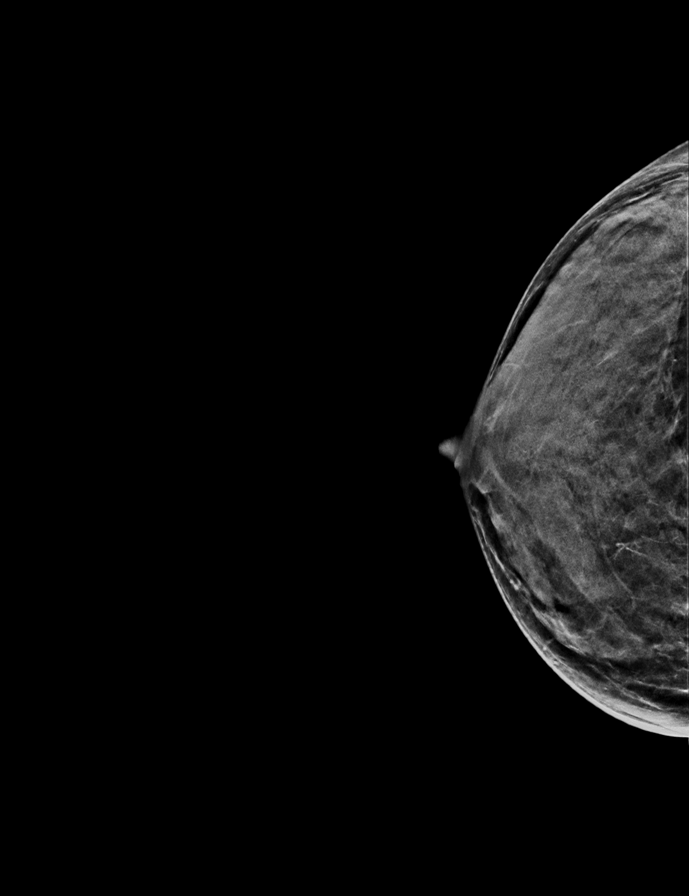

[L CC]
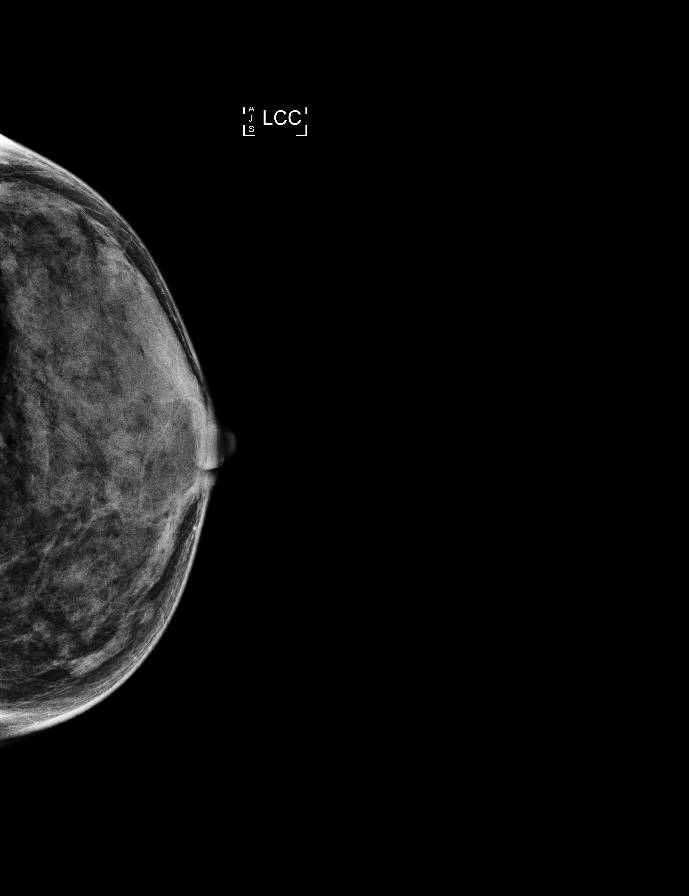

[R CC tomo · tomo slice 19/36.0]
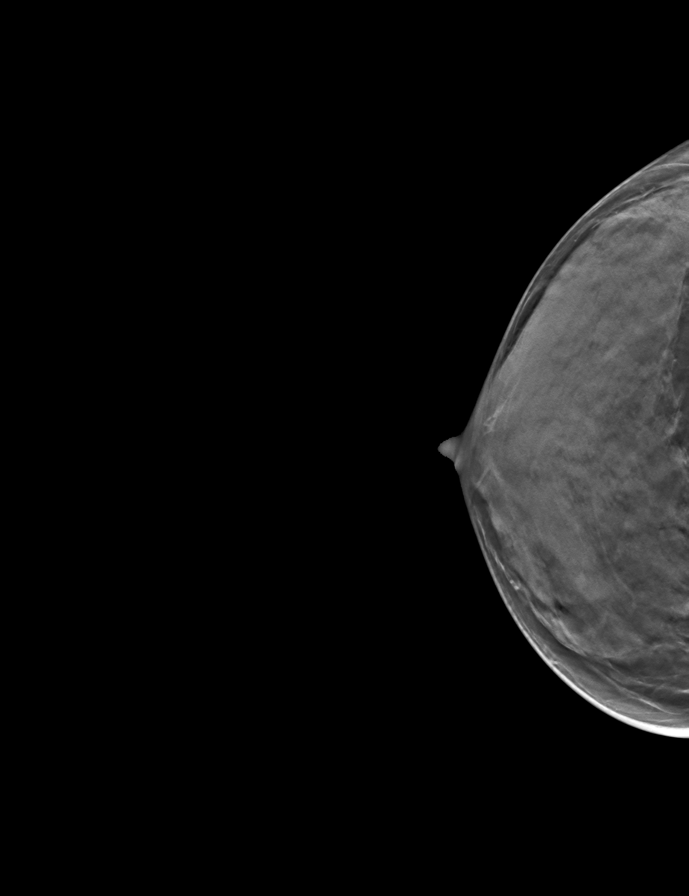

[9 of 28 positions shown; findings below may reference images not displayed]

ACR Breast Density Category d: The breast tissue is extremely dense,
which lowers the sensitivity of mammography.
FINDINGS: There are no findings suspicious for malignancy. Images were
processed with CAD.
IMPRESSION: No mammographic evidence of malignancy. A result letter of this
screening mammogram will be mailed directly to the patient.

RECOMMENDATION:
Screening mammogram in one year. (Code:JD-S-SN3)

BI-RADS CATEGORY  1: Negative.

## 2016-05-25 ENCOUNTER — Other Ambulatory Visit: Payer: Self-pay | Admitting: Internal Medicine

## 2016-08-09 ENCOUNTER — Ambulatory Visit (INDEPENDENT_AMBULATORY_CARE_PROVIDER_SITE_OTHER): Payer: Managed Care, Other (non HMO) | Admitting: Internal Medicine

## 2016-08-09 ENCOUNTER — Encounter: Payer: Self-pay | Admitting: Internal Medicine

## 2016-08-09 VITALS — BP 110/72 | HR 77 | Temp 97.8°F | Resp 12 | Ht 63.0 in | Wt 117.0 lb

## 2016-08-09 DIAGNOSIS — E559 Vitamin D deficiency, unspecified: Secondary | ICD-10-CM

## 2016-08-09 DIAGNOSIS — R5382 Chronic fatigue, unspecified: Secondary | ICD-10-CM | POA: Diagnosis not present

## 2016-08-09 DIAGNOSIS — Z0000 Encounter for general adult medical examination without abnormal findings: Secondary | ICD-10-CM

## 2016-08-09 DIAGNOSIS — Z1231 Encounter for screening mammogram for malignant neoplasm of breast: Secondary | ICD-10-CM

## 2016-08-09 DIAGNOSIS — Z1239 Encounter for other screening for malignant neoplasm of breast: Secondary | ICD-10-CM

## 2016-08-09 DIAGNOSIS — E034 Atrophy of thyroid (acquired): Secondary | ICD-10-CM | POA: Diagnosis not present

## 2016-08-09 DIAGNOSIS — Z800 Family history of malignant neoplasm of digestive organs: Secondary | ICD-10-CM

## 2016-08-09 LAB — COMPREHENSIVE METABOLIC PANEL
ALBUMIN: 4.7 g/dL (ref 3.5–5.2)
ALT: 14 U/L (ref 0–35)
AST: 18 U/L (ref 0–37)
Alkaline Phosphatase: 56 U/L (ref 39–117)
BUN: 16 mg/dL (ref 6–23)
CHLORIDE: 101 meq/L (ref 96–112)
CO2: 30 mEq/L (ref 19–32)
CREATININE: 0.7 mg/dL (ref 0.40–1.20)
Calcium: 9.7 mg/dL (ref 8.4–10.5)
GFR: 90.79 mL/min (ref >60.00)
Glucose, Bld: 99 mg/dL (ref 70–99)
Potassium: 4.4 mEq/L (ref 3.5–5.1)
SODIUM: 138 meq/L (ref 135–145)
Total Bilirubin: 0.5 mg/dL (ref 0.2–1.2)
Total Protein: 6.9 g/dL (ref 6.0–8.3)

## 2016-08-09 LAB — CBC WITH DIFFERENTIAL/PLATELET
BASOS ABS: 0 10*3/uL (ref 0.0–0.1)
Basophils Relative: 0.6 % (ref 0.0–3.0)
EOS ABS: 0.1 10*3/uL (ref 0.0–0.7)
Eosinophils Relative: 1.3 % (ref 0.0–5.0)
HCT: 42.7 % (ref 36.0–46.0)
HEMOGLOBIN: 14.5 g/dL (ref 12.0–15.0)
Lymphocytes Relative: 18.3 % (ref 12.0–46.0)
Lymphs Abs: 1 10*3/uL (ref 0.7–4.0)
MCHC: 34 g/dL (ref 30.0–36.0)
MCV: 99.3 fl (ref 78.0–100.0)
MONO ABS: 0.4 10*3/uL (ref 0.1–1.0)
Monocytes Relative: 6.9 % (ref 3.0–12.0)
Neutro Abs: 4 10*3/uL (ref 1.4–7.7)
Neutrophils Relative %: 72.9 % (ref 43.0–77.0)
Platelets: 222 10*3/uL (ref 150.0–400.0)
RBC: 4.3 Mil/uL (ref 3.87–5.11)
RDW: 12.8 % (ref 11.5–15.5)
WBC: 5.4 10*3/uL (ref 4.0–10.5)

## 2016-08-09 LAB — VITAMIN D 25 HYDROXY (VIT D DEFICIENCY, FRACTURES): VITD: 52.89 ng/mL (ref 30.00–100.00)

## 2016-08-09 LAB — TSH: TSH: 1.55 u[IU]/mL (ref 0.35–4.50)

## 2016-08-09 NOTE — Progress Notes (Signed)
Pre-visit discussion using our clinic review tool. No additional management support is needed unless otherwise documented below in the visit note.  

## 2016-08-09 NOTE — Progress Notes (Signed)
Patient ID: DAJIAH KOOI, female    DOB: [birth year]  Age: 60 y.o. MRN: [id number]  The patient is here for annual  wellness examination and management of other chronic and acute problems.    3 d mammogram ordered  PAP normal 2015 colonoscopy 2008  The risk factors are reflected in the social history.   The roster of all physicians providing medical care to patient - is listed in the Snapshot section of the chart.  Activities of daily living:  The patient is 100% independent in all ADLs: dressing, toileting, feeding as well as independent mobility  Home safety : The patient has smoke detectors in the home. They wear seatbelts.  There are no firearms at home. There is no violence in the home.   There is no risks for hepatitis, STDs or HIV. There is no   history of blood transfusion. They have no travel history to infectious disease endemic areas of the world.  The patient has seen their dentist in the last six month. They have seen their eye doctor in the last year. They admit to slight hearing difficulty with regard to whispered voices and some television programs.  They have deferred audiologic testing in the last year.  They do not  have excessive sun exposure. Discussed the need for sun protection: hats, long sleeves and use of sunscreen if there is significant sun exposure.   Diet: the importance of a healthy diet is discussed. They do have a healthy diet.  The benefits of regular aerobic exercise were discussed. She exercises 5 days per week an hour or more daily    Depression screen: there are no signs or vegative symptoms of depression- irritability, change in appetite, anhedonia, sadness/tearfullness.  The following portions of the patient's history were reviewed and updated as appropriate: allergies, current medications, past family history, past medical history,  past surgical history, past social history  and problem list.  Visual acuity was not assessed per patient preference  since she has regular follow up with her ophthalmologist. Hearing and body mass index were assessed and reviewed.   During the course of the visit the patient was educated and counseled about appropriate screening and preventive services including : fall prevention , diabetes screening, nutrition counseling, colorectal cancer screening, and recommended immunizations.    CC: The primary encounter diagnosis was Screening for breast cancer. Diagnoses of Chronic fatigue, Vitamin D deficiency, Hypothyroidism due to acquired atrophy of thyroid, Visit for preventive health examination, and Family history of colon cancer requiring screening colonoscopy were also pertinent to this visit.  History Caitlyn Gregory has a past medical history of Allergy; Anemia; Asthma; Fibrocystic breast disease; History of migraine headaches; Hyperlipidemia; Thyroid disease; and Vitamin D deficiency.   She has a past surgical history that includes chin implant (1980s); Appendectomy (1967); Frontalis suspension (2008); Hernia repair (2008); and Breast cyst aspiration (Left, 2009).   Her family history includes Alcohol abuse in her brother; Breast cancer (age of onset: 36) in her mother; Cancer in her mother; Heart disease (age of onset: 55) in her father; Osteoporosis in her mother; Stroke (age of onset: 3) in her mother.She reports that she has never smoked. She has never used smokeless tobacco. She reports that she does not drink alcohol or use drugs.  Outpatient Medications Prior to Visit  Medication Sig Dispense Refill  . Aspirin-Acetaminophen-Caffeine (EXCEDRIN PO) Take by mouth daily as needed     . Cholecalciferol (VITAMIN D) 2000 UNITS CAPS Take one by mouth daily     .  SUMAtriptan (IMITREX) 100 MG tablet ONE-HALF TABLET BY MOUTH AT ONSET THEN ONE TABLET IN 2 HRS IF NEEDED (NEED APPTBEFORE NEXT FILL) 9 tablet 5  . SYNTHROID 75 MCG tablet TAKE ONE TABLET EVERY DAY 90 tablet 1  . calcipotriene-betamethasone (TACLONEX) ointment  Apply 1 application topically daily.    . montelukast (SINGULAIR) 10 MG tablet Take 1 tablet (10 mg total) by mouth at bedtime. (Patient not taking: Reported on 08/08/2015) 30 tablet 3   No facility-administered medications prior to visit.     Review of Systems   Patient denies headache, fevers, malaise, unintentional weight loss, skin rash, eye pain, sinus congestion and sinus pain, sore throat, dysphagia,  hemoptysis , cough, dyspnea, wheezing, chest pain, palpitations, orthopnea, edema, abdominal pain, nausea, melena, diarrhea, constipation, flank pain, dysuria, hematuria, urinary  Frequency, nocturia, numbness, tingling, seizures,  Focal weakness, Loss of consciousness,  Tremor, insomnia, depression, anxiety, and suicidal ideation.     Objective:  BP 110/72   Pulse 77   Temp 97.8 F (36.6 C) (Oral)   Resp 12   Ht 5\' 3"  (1.6 m)   Wt 117 lb (53.1 kg)   SpO2 98%   BMI 20.73 kg/m   Physical Exam   General appearance: alert, cooperative and appears stated age Head: Normocephalic, without obvious abnormality, atraumatic Eyes: conjunctivae/corneas clear. PERRL, EOM's intact. Fundi benign. Ears: normal TM's and external ear canals both ears Nose: Nares normal. Septum midline. Mucosa normal. No drainage or sinus tenderness. Throat: lips, mucosa, and tongue normal; teeth and gums normal Neck: no adenopathy, no carotid bruit, no JVD, supple, symmetrical, trachea midline and thyroid not enlarged, symmetric, no tenderness/mass/nodules Lungs: clear to auscultation bilaterally Breasts: normal appearance, no masses or tenderness Heart: regular rate and rhythm, S1, S2 normal, no murmur, click, rub or gallop Abdomen: soft, non-tender; bowel sounds normal; no masses,  no organomegaly Extremities: extremities normal, atraumatic, no cyanosis or edema Pulses: 2+ and symmetric Skin: Skin color, texture, turgor normal. No rashes or lesions Neurologic: Alert and oriented X 3, normal strength and  tone. Normal symmetric reflexes. Normal coordination and gait.      Assessment & Plan:   Problem List Items Addressed This Visit    Family history of colon cancer requiring screening colonoscopy    She deferred 5 year follow up,  And will be due for 10 yr follow up in 2018      Fatigue   Relevant Orders   TSH (Completed)   CBC with Differential/Platelet (Completed)   Comprehensive metabolic panel (Completed)   Hypothyroidism    Thyroid function is WNL on current dose.  No current changes needed.   Lab Results  Component Value Date   TSH 1.55 08/09/2016         Visit for preventive health examination    Annual comprehensive preventive exam was done as well as an evaluation and management of chronic conditions .  During the course of the visit the patient was educated and counseled about appropriate screening and preventive services including :  diabetes screening, lipid analysis with projected  10 year  risk for CAD , nutrition counseling, breast, cervical and colorectal cancer screening, and recommended immunizations.  Printed recommendations for health maintenance screenings was given       Other Visit Diagnoses    Screening for breast cancer    -  Primary   Relevant Orders   MM SCREENING BREAST TOMO BILATERAL   Vitamin D deficiency       Relevant Orders  VITAMIN D 25 Hydroxy (Vit-D Deficiency, Fractures) (Completed)      I have discontinued Ms. Piggott's calcipotriene-betamethasone and montelukast. I am also having her maintain her Vitamin D, Aspirin-Acetaminophen-Caffeine (EXCEDRIN PO), SUMAtriptan, SYNTHROID, Calcipotriene-Betameth Diprop, and fexofenadine.  Meds ordered this encounter  Medications  . Calcipotriene-Betameth Diprop (ENSTILAR) 0.005-0.064 % FOAM    Sig: Apply 1 application topically daily.  . fexofenadine (ALLEGRA) 180 MG tablet    Sig: Take 180 mg by mouth daily.    Medications Discontinued During This Encounter  Medication Reason  . montelukast  (SINGULAIR) 10 MG tablet Change in therapy  . calcipotriene-betamethasone (TACLONEX) ointment Change in therapy    Follow-up: No Follow-up on file.   Sherlene ShamsULLO, TERESA L, MD

## 2016-08-09 NOTE — Patient Instructions (Signed)
Good to see you!  If you need anything, please do not hesitate to call me  [phone]  Here are the names of several VERY well respected female therapists  If you decide to treat yourself:  Lennon Alstrom    [phone] [place]   [phone] [place] [phone]  Achilles Dunk  [phone]  [place] Maintenance for Postmenopausal Women Introduction Menopause is a normal process in which your reproductive ability comes to an end. This process happens gradually over a span of months to years, usually between the ages of 52 and 86. Menopause is complete when you have missed 12 consecutive menstrual periods. It is important to talk with your health care provider about some of the most common conditions that affect postmenopausal women, such as heart disease, cancer, and bone loss (osteoporosis). Adopting a healthy lifestyle and getting preventive care can help to promote your health and wellness. Those actions can also lower your chances of developing some of these common conditions. What should I know about menopause? During menopause, you may experience a number of symptoms, such as:  Moderate-to-severe hot flashes.  Night sweats.  Decrease in sex drive.  Mood swings.  Headaches.  Tiredness.  Irritability.  Memory problems.  Insomnia. Choosing to treat or not to treat menopausal changes is an individual decision that you make with your health care provider. What should I know about hormone replacement therapy and supplements? Hormone therapy products are effective for treating symptoms that are associated with menopause, such as hot flashes and night sweats. Hormone replacement carries certain risks, especially as you become older. If you are thinking about using estrogen or estrogen with progestin treatments, discuss the benefits and risks with your health care provider. What should I know about heart disease  and stroke? Heart disease, heart attack, and stroke become more likely as you age. This may be due, in part, to the hormonal changes that your body experiences during menopause. These can affect how your body processes dietary fats, triglycerides, and cholesterol. Heart attack and stroke are both medical emergencies. There are many things that you can do to help prevent heart disease and stroke:  Have your blood pressure checked at least every 1-2 years. High blood pressure causes heart disease and increases the risk of stroke.  If you are 31-46 years old, ask your health care provider if you should take aspirin to prevent a heart attack or a stroke.  Do not use any tobacco products, including cigarettes, chewing tobacco, or electronic cigarettes. If you need help quitting, ask your health care provider.  It is important to eat a healthy diet and maintain a healthy weight.  Be sure to include plenty of vegetables, fruits, low-fat dairy products, and lean protein.  Avoid eating foods that are high in solid fats, added sugars, or salt (sodium).  Get regular exercise. This is one of the most important things that you can do for your health.  Try to exercise for at least 150 minutes each week. The type of exercise that you do should increase your heart rate and make you sweat. This is known as moderate-intensity exercise.  Try to do strengthening exercises at least twice each week. Do these in addition to the moderate-intensity exercise.  Know your numbers.Ask your health care provider to check your cholesterol and your blood glucose. Continue to have your blood tested as directed by your health care provider. What should I know  about cancer screening? There are several types of cancer. Take the following steps to reduce your risk and to catch any cancer development as early as possible. Breast Cancer  Practice breast self-awareness.  This means understanding how your breasts normally  appear and feel.  It also means doing regular breast self-exams. Let your health care provider know about any changes, no matter how small.  If you are 53 or older, have a clinician do a breast exam (clinical breast exam or CBE) every year. Depending on your age, family history, and medical history, it may be recommended that you also have a yearly breast X-ray (mammogram).  If you have a family history of breast cancer, talk with your health care provider about genetic screening.  If you are at high risk for breast cancer, talk with your health care provider about having an MRI and a mammogram every year.  Breast cancer (BRCA) gene test is recommended for women who have family members with BRCA-related cancers. Results of the assessment will determine the need for genetic counseling and BRCA1 and for BRCA2 testing. BRCA-related cancers include these types:  Breast. This occurs in males or females.  Ovarian.  Tubal. This may also be called fallopian tube cancer.  Cancer of the abdominal or pelvic lining (peritoneal cancer).  Prostate.  Pancreatic. Cervical, Uterine, and Ovarian Cancer  Your health care provider may recommend that you be screened regularly for cancer of the pelvic organs. These include your ovaries, uterus, and vagina. This screening involves a pelvic exam, which includes checking for microscopic changes to the surface of your cervix (Pap test).  For women ages 21-65, health care providers may recommend a pelvic exam and a Pap test every three years. For women ages 106-65, they may recommend the Pap test and pelvic exam, combined with testing for human papilloma virus (HPV), every five years. Some types of HPV increase your risk of cervical cancer. Testing for HPV may also be done on women of any age who have unclear Pap test results.  Other health care providers may not recommend any screening for nonpregnant women who are considered low risk for pelvic cancer and have no  symptoms. Ask your health care provider if a screening pelvic exam is right for you.  If you have had past treatment for cervical cancer or a condition that could lead to cancer, you need Pap tests and screening for cancer for at least 20 years after your treatment. If Pap tests have been discontinued for you, your risk factors (such as having a new sexual partner) need to be reassessed to determine if you should start having screenings again. Some women have medical problems that increase the chance of getting cervical cancer. In these cases, your health care provider may recommend that you have screening and Pap tests more often.  If you have a family history of uterine cancer or ovarian cancer, talk with your health care provider about genetic screening.  If you have vaginal bleeding after reaching menopause, tell your health care provider.  There are currently no reliable tests available to screen for ovarian cancer. Lung Cancer  Lung cancer screening is recommended for adults 77-66 years old who are at high risk for lung cancer because of a history of smoking. A yearly low-dose CT scan of the lungs is recommended if you:  Currently smoke.  Have a history of at least 30 pack-years of smoking and you currently smoke or have quit within the past 15 years. A pack-year  is smoking an average of one pack of cigarettes per day for one year. Yearly screening should:  Continue until it has been 15 years since you quit.  Stop if you develop a health problem that would prevent you from having lung cancer treatment. Colorectal Cancer  This type of cancer can be detected and can often be prevented.  Routine colorectal cancer screening usually begins at age 42 and continues through age 18.  If you have risk factors for colon cancer, your health care provider may recommend that you be screened at an earlier age.  If you have a family history of colorectal cancer, talk with your health care provider  about genetic screening.  Your health care provider may also recommend using home test kits to check for hidden blood in your stool.  A small camera at the end of a tube can be used to examine your colon directly (sigmoidoscopy or colonoscopy). This is done to check for the earliest forms of colorectal cancer.  Direct examination of the colon should be repeated every 5-10 years until age 28. However, if early forms of precancerous polyps or small growths are found or if you have a family history or genetic risk for colorectal cancer, you may need to be screened more often. Skin Cancer  Check your skin from head to toe regularly.  Monitor any moles. Be sure to tell your health care provider:  About any new moles or changes in moles, especially if there is a change in a mole's shape or color.  If you have a mole that is larger than the size of a pencil eraser.  If any of your family members has a history of skin cancer, especially at a young age, talk with your health care provider about genetic screening.  Always use sunscreen. Apply sunscreen liberally and repeatedly throughout the day.  Whenever you are outside, protect yourself by wearing long sleeves, pants, a wide-brimmed hat, and sunglasses. What should I know about osteoporosis? Osteoporosis is a condition in which bone destruction happens more quickly than new bone creation. After menopause, you may be at an increased risk for osteoporosis. To help prevent osteoporosis or the bone fractures that can happen because of osteoporosis, the following is recommended:  If you are 73-61 years old, get at least 1,000 mg of calcium and at least 600 mg of vitamin D per day.  If you are older than age 78 but younger than age 37, get at least 1,200 mg of calcium and at least 600 mg of vitamin D per day.  If you are older than age 44, get at least 1,200 mg of calcium and at least 800 mg of vitamin D per day. Smoking and excessive alcohol intake  increase the risk of osteoporosis. Eat foods that are rich in calcium and vitamin D, and do weight-bearing exercises several times each week as directed by your health care provider. What should I know about how menopause affects my mental health? Depression may occur at any age, but it is more common as you become older. Common symptoms of depression include:  Low or sad mood.  Changes in sleep patterns.  Changes in appetite or eating patterns.  Feeling an overall lack of motivation or enjoyment of activities that you previously enjoyed.  Frequent crying spells. Talk with your health care provider if you think that you are experiencing depression. What should I know about immunizations? It is important that you get and maintain your immunizations. These include:  Tetanus, diphtheria, and pertussis (Tdap) booster vaccine.  Influenza every year before the flu season begins.  Pneumonia vaccine.  Shingles vaccine. Your health care provider may also recommend other immunizations. This information is not intended to replace advice given to you by your health care provider. Make sure you discuss any questions you have with your health care provider. Document Released: 10/08/2005 Document Revised: 03/05/2016 Document Reviewed: 05/20/2015  2017 Elsevier

## 2016-08-11 ENCOUNTER — Other Ambulatory Visit: Payer: Self-pay | Admitting: Internal Medicine

## 2016-08-11 DIAGNOSIS — Z800 Family history of malignant neoplasm of digestive organs: Secondary | ICD-10-CM

## 2016-08-11 NOTE — Assessment & Plan Note (Signed)
Thyroid function is WNL on current dose.  No current changes needed.   Lab Results  Component Value Date   TSH 1.55 08/09/2016

## 2016-08-11 NOTE — Assessment & Plan Note (Addendum)
She deferred 5 year follow up,  And will be due for 10 yr follow up in 2018

## 2016-08-11 NOTE — Assessment & Plan Note (Signed)
Annual comprehensive preventive exam was done as well as an evaluation and management of chronic conditions .  During the course of the visit the patient was educated and counseled about appropriate screening and preventive services including :  diabetes screening, lipid analysis with projected  10 year  risk for CAD , nutrition counseling, breast, cervical and colorectal cancer screening, and recommended immunizations.  Printed recommendations for health maintenance screenings was given 

## 2016-11-11 ENCOUNTER — Other Ambulatory Visit: Payer: Self-pay | Admitting: Internal Medicine

## 2016-12-01 ENCOUNTER — Other Ambulatory Visit: Payer: Self-pay | Admitting: Internal Medicine

## 2016-12-14 ENCOUNTER — Ambulatory Visit
Admission: RE | Admit: 2016-12-14 | Discharge: 2016-12-14 | Disposition: A | Discharge status: Home / Self Care | Payer: Managed Care, Other (non HMO) | Source: Ambulatory Visit | Attending: Internal Medicine | Admitting: Internal Medicine

## 2016-12-14 DIAGNOSIS — Z1239 Encounter for other screening for malignant neoplasm of breast: Secondary | ICD-10-CM

## 2016-12-14 DIAGNOSIS — Z1231 Encounter for screening mammogram for malignant neoplasm of breast: Secondary | ICD-10-CM | POA: Diagnosis present

## 2017-04-05 ENCOUNTER — Ambulatory Visit: Payer: Managed Care, Other (non HMO) | Admitting: Internal Medicine

## 2017-04-18 ENCOUNTER — Other Ambulatory Visit: Payer: Self-pay | Admitting: Internal Medicine

## 2017-05-24 ENCOUNTER — Encounter: Payer: Self-pay | Admitting: Internal Medicine

## 2017-05-25 ENCOUNTER — Other Ambulatory Visit: Payer: Self-pay | Admitting: Internal Medicine

## 2017-05-25 MED ORDER — SUMATRIPTAN SUCCINATE 100 MG PO TABS: ORAL_TABLET | ORAL | 2 refills | Status: DC

## 2017-06-03 ENCOUNTER — Other Ambulatory Visit: Payer: Self-pay | Admitting: Internal Medicine

## 2017-06-04 ENCOUNTER — Other Ambulatory Visit: Payer: Self-pay | Admitting: Internal Medicine

## 2017-06-06 ENCOUNTER — Other Ambulatory Visit: Payer: Self-pay | Admitting: Internal Medicine

## 2017-07-09 ENCOUNTER — Other Ambulatory Visit: Payer: Self-pay | Admitting: Internal Medicine

## 2017-07-11 NOTE — Telephone Encounter (Signed)
NO since 08/09/16 please advise can refill Sumatriptan?

## 2017-07-12 NOTE — Telephone Encounter (Signed)
refilled 

## 2017-08-11 ENCOUNTER — Encounter: Payer: Self-pay | Admitting: Internal Medicine

## 2017-08-11 ENCOUNTER — Other Ambulatory Visit (HOSPITAL_COMMUNITY)
Admission: RE | Admit: 2017-08-11 | Discharge: 2017-08-11 | Disposition: A | Discharge status: Home / Self Care | Payer: 59 | Source: Ambulatory Visit | Attending: Internal Medicine | Admitting: Internal Medicine

## 2017-08-11 ENCOUNTER — Ambulatory Visit (INDEPENDENT_AMBULATORY_CARE_PROVIDER_SITE_OTHER): Payer: 59 | Admitting: Internal Medicine

## 2017-08-11 VITALS — BP 126/74 | HR 77 | Temp 98.2°F | Resp 15 | Ht 63.0 in | Wt 116.2 lb

## 2017-08-11 DIAGNOSIS — J309 Allergic rhinitis, unspecified: Secondary | ICD-10-CM | POA: Diagnosis not present

## 2017-08-11 DIAGNOSIS — N949 Unspecified condition associated with female genital organs and menstrual cycle: Secondary | ICD-10-CM | POA: Diagnosis not present

## 2017-08-11 DIAGNOSIS — E559 Vitamin D deficiency, unspecified: Secondary | ICD-10-CM | POA: Diagnosis not present

## 2017-08-11 DIAGNOSIS — I999 Unspecified disorder of circulatory system: Secondary | ICD-10-CM | POA: Diagnosis not present

## 2017-08-11 DIAGNOSIS — Z0000 Encounter for general adult medical examination without abnormal findings: Secondary | ICD-10-CM

## 2017-08-11 DIAGNOSIS — Z800 Family history of malignant neoplasm of digestive organs: Secondary | ICD-10-CM

## 2017-08-11 DIAGNOSIS — M79672 Pain in left foot: Secondary | ICD-10-CM | POA: Diagnosis not present

## 2017-08-11 DIAGNOSIS — Z124 Encounter for screening for malignant neoplasm of cervix: Secondary | ICD-10-CM

## 2017-08-11 DIAGNOSIS — R5383 Other fatigue: Secondary | ICD-10-CM | POA: Diagnosis not present

## 2017-08-11 DIAGNOSIS — E034 Atrophy of thyroid (acquired): Secondary | ICD-10-CM

## 2017-08-11 LAB — COMPREHENSIVE METABOLIC PANEL
ALBUMIN: 4.5 g/dL (ref 3.5–5.2)
ALK PHOS: 54 U/L (ref 39–117)
ALT: 13 U/L (ref 0–35)
AST: 19 U/L (ref 0–37)
BUN: 15 mg/dL (ref 6–23)
CHLORIDE: 102 meq/L (ref 96–112)
CO2: 32 mEq/L (ref 19–32)
Calcium: 9.5 mg/dL (ref 8.4–10.5)
Creatinine, Ser: 0.75 mg/dL (ref 0.40–1.20)
GFR: 83.56 mL/min (ref >60.00)
Glucose, Bld: 78 mg/dL (ref 70–99)
POTASSIUM: 4 meq/L (ref 3.5–5.1)
SODIUM: 139 meq/L (ref 135–145)
TOTAL PROTEIN: 7.2 g/dL (ref 6.0–8.3)
Total Bilirubin: 0.7 mg/dL (ref 0.2–1.2)

## 2017-08-11 LAB — LIPID PANEL
Cholesterol: 167 mg/dL (ref 0–200)
HDL: 79.3 mg/dL (ref >39.00)
LDL CALC: 79 mg/dL (ref 0–99)
NONHDL: 87.47
Total CHOL/HDL Ratio: 2
Triglycerides: 44 mg/dL (ref 0.0–149.0)
VLDL: 8.8 mg/dL (ref 0.0–40.0)

## 2017-08-11 LAB — HEPATITIS C ANTIBODY
HEP C AB: NONREACTIVE
SIGNAL TO CUT-OFF: 0.02 (ref <1.00)

## 2017-08-11 LAB — TSH: TSH: 1.69 u[IU]/mL (ref 0.35–4.50)

## 2017-08-11 LAB — HIV ANTIBODY (ROUTINE TESTING W REFLEX): HIV: NONREACTIVE

## 2017-08-11 LAB — VITAMIN D 25 HYDROXY (VIT D DEFICIENCY, FRACTURES): VITD: 55.13 ng/mL (ref 30.00–100.00)

## 2017-08-11 MED ORDER — MONTELUKAST SODIUM 10 MG PO TABS: 10.0000 mg | ORAL_TABLET | Freq: Every day | ORAL | 3 refills | Status: DC

## 2017-08-11 MED ORDER — ZOSTER VAC RECOMB ADJUVANTED 50 MCG/0.5ML IM SUSR: 0.5000 mL | Freq: Once | INTRAMUSCULAR | 1 refills | Status: AC

## 2017-08-11 NOTE — Patient Instructions (Addendum)
FOR  YOUR ALLERGIES :    we are adding  Singulair  ONCE DAILY   I recommend Samara Deist for podiatry [facility])  [place] Maintenance for Postmenopausal Women Menopause is a normal process in which your reproductive ability comes to an end. This process happens gradually over a span of months to years, usually between the ages of 67 and 79. Menopause is complete when you have missed 12 consecutive menstrual periods. It is important to talk with your health care provider about some of the most common conditions that affect postmenopausal women, such as heart disease, cancer, and bone loss (osteoporosis). Adopting a healthy lifestyle and getting preventive care can help to promote your health and wellness. Those actions can also lower your chances of developing some of these common conditions. What should I know about menopause? During menopause, you may experience a number of symptoms, such as:  Moderate-to-severe hot flashes.  Night sweats.  Decrease in sex drive.  Mood swings.  Headaches.  Tiredness.  Irritability.  Memory problems.  Insomnia.  Choosing to treat or not to treat menopausal changes is an individual decision that you make with your health care provider. What should I know about hormone replacement therapy and supplements? Hormone therapy products are effective for treating symptoms that are associated with menopause, such as hot flashes and night sweats. Hormone replacement carries certain risks, especially as you become older. If you are thinking about using estrogen or estrogen with progestin treatments, discuss the benefits and risks with your health care provider. What should I know about heart disease and stroke? Heart disease, heart attack, and stroke become more likely as you age. This may be due, in part, to the hormonal changes that your body experiences during menopause. These can affect how your body processes dietary fats,  triglycerides, and cholesterol. Heart attack and stroke are both medical emergencies. There are many things that you can do to help prevent heart disease and stroke:  Have your blood pressure checked at least every 1-2 years. High blood pressure causes heart disease and increases the risk of stroke.  If you are 47-60 years old, ask your health care provider if you should take aspirin to prevent a heart attack or a stroke.  Do not use any tobacco products, including cigarettes, chewing tobacco, or electronic cigarettes. If you need help quitting, ask your health care provider.  It is important to eat a healthy diet and maintain a healthy weight. ? Be sure to include plenty of vegetables, fruits, low-fat dairy products, and lean protein. ? Avoid eating foods that are high in solid fats, added sugars, or salt (sodium).  Get regular exercise. This is one of the most important things that you can do for your health. ? Try to exercise for at least 150 minutes each week. The type of exercise that you do should increase your heart rate and make you sweat. This is known as moderate-intensity exercise. ? Try to do strengthening exercises at least twice each week. Do these in addition to the moderate-intensity exercise.  Know your numbers.Ask your health care provider to check your cholesterol and your blood glucose. Continue to have your blood tested as directed by your health care provider.  What should I know about cancer screening? There are several types of cancer. Take the following steps to reduce your risk and to catch any cancer development as early as possible. Breast Cancer  Practice breast self-awareness. ? This means  understanding how your breasts normally appear and feel. ? It also means doing regular breast self-exams. Let your health care provider know about any changes, no matter how small.  If you are 60 or older, have a clinician do a breast exam (clinical breast exam or CBE)  every year. Depending on your age, family history, and medical history, it may be recommended that you also have a yearly breast X-ray (mammogram).  If you have a family history of breast cancer, talk with your health care provider about genetic screening.  If you are at high risk for breast cancer, talk with your health care provider about having an MRI and a mammogram every year.  Breast cancer (BRCA) gene test is recommended for women who have family members with BRCA-related cancers. Results of the assessment will determine the need for genetic counseling and BRCA1 and for BRCA2 testing. BRCA-related cancers include these types: ? Breast. This occurs in males or females. ? Ovarian. ? Tubal. This may also be called fallopian tube cancer. ? Cancer of the abdominal or pelvic lining (peritoneal cancer). ? Prostate. ? Pancreatic.  Cervical, Uterine, and Ovarian Cancer Your health care provider may recommend that you be screened regularly for cancer of the pelvic organs. These include your ovaries, uterus, and vagina. This screening involves a pelvic exam, which includes checking for microscopic changes to the surface of your cervix (Pap test).  For women ages 21-60, health care providers may recommend a pelvic exam and a Pap test every three years. For women ages 87-60, they may recommend the Pap test and pelvic exam, combined with testing for human papilloma virus (HPV), every five years. Some types of HPV increase your risk of cervical cancer. Testing for HPV may also be done on women of any age who have unclear Pap test results.  Other health care providers may not recommend any screening for nonpregnant women who are considered low risk for pelvic cancer and have no symptoms. Ask your health care provider if a screening pelvic exam is right for you.  If you have had past treatment for cervical cancer or a condition that could lead to cancer, you need Pap tests and screening for cancer for at  least 20 years after your treatment. If Pap tests have been discontinued for you, your risk factors (such as having a new sexual partner) need to be reassessed to determine if you should start having screenings again. Some women have medical problems that increase the chance of getting cervical cancer. In these cases, your health care provider may recommend that you have screening and Pap tests more often.  If you have a family history of uterine cancer or ovarian cancer, talk with your health care provider about genetic screening.  If you have vaginal bleeding after reaching menopause, tell your health care provider.  There are currently no reliable tests available to screen for ovarian cancer.  Lung Cancer Lung cancer screening is recommended for adults 63-26 years old who are at high risk for lung cancer because of a history of smoking. A yearly low-dose CT scan of the lungs is recommended if you:  Currently smoke.  Have a history of at least 30 pack-years of smoking and you currently smoke or have quit within the past 15 years. A pack-year is smoking an average of one pack of cigarettes per day for one year.  Yearly screening should:  Continue until it has been 15 years since you quit.  Stop if you develop a health  problem that would prevent you from having lung cancer treatment.  Colorectal Cancer  This type of cancer can be detected and can often be prevented.  Routine colorectal cancer screening usually begins at age 19 and continues through age 45.  If you have risk factors for colon cancer, your health care provider may recommend that you be screened at an earlier age.  If you have a family history of colorectal cancer, talk with your health care provider about genetic screening.  Your health care provider may also recommend using home test kits to check for hidden blood in your stool.  A small camera at the end of a tube can be used to examine your colon directly  (sigmoidoscopy or colonoscopy). This is done to check for the earliest forms of colorectal cancer.  Direct examination of the colon should be repeated every 5-10 years until age 88. However, if early forms of precancerous polyps or small growths are found or if you have a family history or genetic risk for colorectal cancer, you may need to be screened more often.  Skin Cancer  Check your skin from head to toe regularly.  Monitor any moles. Be sure to tell your health care provider: ? About any new moles or changes in moles, especially if there is a change in a mole's shape or color. ? If you have a mole that is larger than the size of a pencil eraser.  If any of your family members has a history of skin cancer, especially at a young age, talk with your health care provider about genetic screening.  Always use sunscreen. Apply sunscreen liberally and repeatedly throughout the day.  Whenever you are outside, protect yourself by wearing long sleeves, pants, a wide-brimmed hat, and sunglasses.  What should I know about osteoporosis? Osteoporosis is a condition in which bone destruction happens more quickly than new bone creation. After menopause, you may be at an increased risk for osteoporosis. To help prevent osteoporosis or the bone fractures that can happen because of osteoporosis, the following is recommended:  If you are 43-71 years old, get at least 1,000 mg of calcium and at least 600 mg of vitamin D per day.  If you are older than age 34 but younger than age 59, get at least 1,200 mg of calcium and at least 600 mg of vitamin D per day.  If you are older than age 4, get at least 1,200 mg of calcium and at least 800 mg of vitamin D per day.  Smoking and excessive alcohol intake increase the risk of osteoporosis. Eat foods that are rich in calcium and vitamin D, and do weight-bearing exercises several times each week as directed by your health care provider. What should I know about  how menopause affects my mental health? Depression may occur at any age, but it is more common as you become older. Common symptoms of depression include:  Low or sad mood.  Changes in sleep patterns.  Changes in appetite or eating patterns.  Feeling an overall lack of motivation or enjoyment of activities that you previously enjoyed.  Frequent crying spells.  Talk with your health care provider if you think that you are experiencing depression. What should I know about immunizations? It is important that you get and maintain your immunizations. These include:  Tetanus, diphtheria, and pertussis (Tdap) booster vaccine.  Influenza every year before the flu season begins.  Pneumonia vaccine.  Shingles vaccine.  Your health care provider may also recommend  other immunizations. This information is not intended to replace advice given to you by your health care provider. Make sure you discuss any questions you have with your health care provider. Document Released: 10/08/2005 Document Revised: 03/05/2016 Document Reviewed: 05/20/2015 Elsevier Interactive Patient Education  2018 [employer].

## 2017-08-11 NOTE — Progress Notes (Signed)
Patient ID: Caitlyn Gregory, female    DOB: 11-05-1955  Age: 61 y.o. MRN: [id number]  The patient is here for annual  Preventive examination and management of other chronic and acute problems.   Mammogram up to date  Flu shot up to date  Normal COLOGUARD FEB 2017    The risk factors are reflected in the social history.  The roster of all physicians providing medical care to patient - is listed in the Snapshot section of the chart.  Activities of daily living:  The patient is 100% independent in all ADLs: dressing, toileting, feeding as well as independent mobility  Home safety : The patient has smoke detectors in the home. They wear seatbelts.  There are no firearms at home. There is no violence in the home.   There is no risks for hepatitis, STDs or HIV. There is no   history of blood transfusion. They have no travel history to infectious disease endemic areas of the world.  The patient has seen their dentist in the last six month. They have seen their eye doctor in the last year. They admit to slight hearing difficulty with regard to whispered voices and some television programs.  They have deferred audiologic testing in the last year.  They do not  have excessive sun exposure. Discussed the need for sun protection: hats, long sleeves and use of sunscreen if there is significant sun exposure.   Diet: the importance of a healthy diet is discussed. They do have a healthy diet.  The benefits of regular aerobic exercise were discussed. She walks 4 times per week ,  20 minutes.   Depression screen: there are no signs or vegative symptoms of depression- irritability, change in appetite, anhedonia, sadness/tearfullness.  The following portions of the patient's history were reviewed and updated as appropriate: allergies, current medications, past family history, past medical history,  past surgical history, past social history  and problem list.  Visual acuity was not assessed per patient preference  since she has regular follow up with her ophthalmologist. Hearing and body mass index were assessed and reviewed.   During the course of the visit the patient was educated and counseled about appropriate screening and preventive services including : fall prevention , diabetes screening, nutrition counseling, colorectal cancer screening, and recommended immunizations.    CC: The primary encounter diagnosis was Visit for preventive health examination. Diagnoses of Screening for cervical cancer, Hypothyroidism due to acquired atrophy of thyroid, Hypovitaminosis D, Vaginal discomfort, Fatigue, unspecified type, Ischemic pain of left foot, Left foot pain, Family history of colon cancer requiring screening colonoscopy, and Allergic rhinitis, unspecified seasonality, unspecified trigger were also pertinent to this visit.   ALLERGIC RHINITIS:  LED TO SINUS INFECTION OVER THANKSGIVING TREATED WITH DOXYCYLINE .  BACK ON NASALCROM.  NEGATIVE TESTING 4 YEARS AGO EXCEPT DUST AND MILDEW.  DISCUSSED ADDING SINGULAIR   HAVING LOW ENERGY FOR THE LAST SEVERAL MONTHS , WONDERS IF SHE IS HYPOMANIC.  Continues to express disappointment in decisions made by her daughters   FOOT PAIN FOR THE LAST TEN YEARS,  LEFT FOOT 2ND TOE HURTING  REMOTE INJURY STILL doing yoga     History [place] has a past medical history of Allergy, Anemia, Asthma, Fibrocystic breast disease, History of migraine headaches, Hyperlipidemia, Thyroid disease, and Vitamin D deficiency.   She has a past surgical history that includes chin implant (1980s); Appendectomy (1967); Frontalis suspension (2008); Hernia repair (2008); and Breast cyst aspiration (Left, 2009).   Her family  history includes Alcohol abuse in her brother; Breast cancer (age of onset: 6880) in her mother; Cancer in her mother; Heart disease (age of onset: 7544) in her father; Osteoporosis in her mother; Stroke (age of onset: 3988) in her mother.She reports that  has never smoked. she has  never used smokeless tobacco. She reports that she does not drink alcohol or use drugs.  Outpatient Medications Prior to Visit  Medication Sig Dispense Refill  . Aspirin-Acetaminophen-Caffeine (EXCEDRIN PO) Take by mouth daily as needed     . Calcipotriene-Betameth Diprop (ENSTILAR) 0.005-0.064 % FOAM Apply 1 application topically daily.    . Cholecalciferol (VITAMIN D) 2000 UNITS CAPS Take one by mouth daily     . SUMAtriptan (IMITREX) 100 MG tablet TAKE ONE-HALF TABLET BY MOUTH AT ONSET THEN ONE TABLET IN TWO HOURS IF NEEDED. 9 tablet 2  . SUMAtriptan (IMITREX) 100 MG tablet ONE-HALF TABLET BY MOUTH AT ONSET THEN 1IN 2 HRS AS NEEDED 9 tablet 0  . SYNTHROID 75 MCG tablet TAKE 1 TABLET BY MOUTH DAILY 90 tablet 1  . fexofenadine (ALLEGRA) 180 MG tablet Take 180 mg by mouth daily.     No facility-administered medications prior to visit.     Review of Systems   Patient denies headache, fevers, malaise, unintentional weight loss, skin rash, eye pain, sinus congestion and sinus pain, sore throat, dysphagia,  hemoptysis , cough, dyspnea, wheezing, chest pain, palpitations, orthopnea, edema, abdominal pain, nausea, melena, diarrhea, constipation, flank pain, dysuria, hematuria, urinary  Frequency, nocturia, numbness, tingling, seizures,  Focal weakness, Loss of consciousness,  Tremor, insomnia,and suicidal ideation.      Objective:  BP 126/74 (BP Location: Left Arm, Patient Position: Sitting, Cuff Size: Normal)   Pulse 77   Temp 98.2 F (36.8 C) (Oral)   Resp 15   Ht 5\' 3"  (1.6 m)   Wt 116 lb 3.2 oz (52.7 kg)   SpO2 98%   BMI 20.58 kg/m   Physical Exam   General Appearance:    Alert, cooperative, no distress, appears stated age  Head:    Normocephalic, without obvious abnormality, atraumatic  Eyes:    PERRL, conjunctiva/corneas clear, EOM's intact, fundi    benign, both eyes  Ears:    Normal TM's and external ear canals, both ears  Nose:   Nares normal, septum midline, mucosa normal,  no drainage    or sinus tenderness  Throat:   Lips, mucosa, and tongue normal; teeth and gums normal  Neck:   Supple, symmetrical, trachea midline, no adenopathy;    thyroid:  no enlargement/tenderness/nodules; no carotid   bruit or JVD  Back:     Symmetric, no curvature, ROM normal, no CVA tenderness  Lungs:     Clear to auscultation bilaterally, respirations unlabored  Chest Wall:    No tenderness or deformity   Heart:    Regular rate and rhythm, S1 and S2 normal, no murmur, rub   or gallop  Breast Exam:    No tenderness, masses, or nipple abnormality  Abdomen:     Soft, non-tender, bowel sounds active all four quadrants,    no masses, no organomegaly  Genitalia:    Pelvic: cervix normal in appearance, external genitalia normal, no adnexal masses or tenderness, no cervical motion tenderness, rectovaginal septum normal, uterus normal size, shape, and consistency and vagina normal without discharge  Extremities:   Extremities normal, atraumatic, no cyanosis or edema  Pulses:   2+ and symmetric all extremities  Skin:   Skin color,  texture, turgor normal, no rashes or lesions  Lymph nodes:   Cervical, supraclavicular, and axillary nodes normal  Neurologic:   CNII-XII intact, normal strength, sensation and reflexes    throughout      Assessment & Plan:   Problem List Items Addressed This Visit    Screening for cervical cancer   Relevant Orders   Cytology - PAP (Completed)   Family history of colon cancer requiring screening colonoscopy    She has deferred 5 yr follow up colonoscopy in 2018 for FH of colon CA ( cousin, NOT A FIRST DEGREE relative)..  cologuard was negative Feb 2017      Fatigue    Chronic and Cyclic. Screening labs normal.  No history of snoring.  Physically fit. Several daughters with psychiatric issues.  wonders if she is bipolar or hypomanic. Will recommend referral to psychiatry.     Lab Results  Component Value Date   TSH 1.69 08/11/2017   Lab Results   Component Value Date   WBC 5.4 08/09/2016   HGB 14.5 08/09/2016   HCT 42.7 08/09/2016   MCV 99.3 08/09/2016   PLT 222.0 08/09/2016         Relevant Orders   HIV antibody (Completed)   Hepatitis C antibody (Completed)   Hypothyroidism    Thyroid function is WNL on current dose.  No current changes needed.   Lab Results  Component Value Date   TSH 1.69 08/11/2017         Relevant Orders   TSH (Completed)   Hypovitaminosis D    Normalized  Continue current supplementation      Relevant Orders   VITAMIN D 25 Hydroxy (Vit-D Deficiency, Fractures) (Completed)   Rhinitis, allergic    Persistent, leading to bacterial sinusitis in November.  Now using nasalcrom with incomplete relief. Trial of singulair.       RESOLVED: Vaginal discomfort   Visit for preventive health examination - Primary    Annual comprehensive preventive exam was done as well as an evaluation and management of chronic conditions .  During the course of the visit the patient was educated and counseled about appropriate screening and preventive services including :  diabetes screening, lipid analysis with projected  10 year  risk for CAD , nutrition counseling, breast, cervical and colorectal cancer screening, and recommended immunizations.  Printed recommendations for health maintenance screenings was given  PAP normal Hep C/HIV normal      Relevant Orders   Lipid panel (Completed)   Comprehensive metabolic panel (Completed)    Other Visit Diagnoses    Ischemic pain of left foot       Left foot pain       Relevant Orders   Ambulatory referral to Podiatry      I have discontinued Sharie H. Fray's fexofenadine. I am also having her start on montelukast and Zoster Vaccine Adjuvanted. Additionally, I am having her maintain her Vitamin D, Aspirin-Acetaminophen-Caffeine (EXCEDRIN PO), Calcipotriene-Betameth Diprop, SUMAtriptan, SYNTHROID, and SUMAtriptan.  Meds ordered this encounter  Medications  .  montelukast (SINGULAIR) 10 MG tablet    Sig: Take 1 tablet (10 mg total) by mouth at bedtime.    Dispense:  30 tablet    Refill:  3  . Zoster Vaccine Adjuvanted [facility](SHINGRIX) injection    Sig: Inject 0.5 mLs into the muscle once for 1 dose.    Dispense:  1 each    Refill:  1    Medications Discontinued During This Encounter  Medication Reason  .  fexofenadine (ALLEGRA) 180 MG tablet Patient has not taken in last 30 days    Follow-up: No Follow-up on file.   Sherlene Shams, MD

## 2017-08-12 LAB — CYTOLOGY - PAP
Diagnosis: NEGATIVE
HPV: NOT DETECTED

## 2017-08-13 NOTE — Assessment & Plan Note (Signed)
She has deferred 5 yr follow up colonoscopy in 2018 for FH of colon CA ( cousin, NOT A FIRST DEGREE relative)..  cologuard was negative Feb 2017

## 2017-08-13 NOTE — Assessment & Plan Note (Signed)
Normalized .  Continue current supplementation 

## 2017-08-13 NOTE — Assessment & Plan Note (Signed)
Chronic and Cyclic. Screening labs normal.  No history of snoring.  Physically fit. Several daughters with psychiatric issues.  wonders if she is bipolar or hypomanic. Will recommend referral to psychiatry.     Lab Results  Component Value Date   TSH 1.69 08/11/2017   Lab Results  Component Value Date   WBC 5.4 08/09/2016   HGB 14.5 08/09/2016   HCT 42.7 08/09/2016   MCV 99.3 08/09/2016   PLT 222.0 08/09/2016

## 2017-08-13 NOTE — Assessment & Plan Note (Signed)
Persistent, leading to bacterial sinusitis in November.  Now using nasalcrom with incomplete relief. Trial of singulair.

## 2017-08-13 NOTE — Assessment & Plan Note (Signed)
Thyroid function is WNL on current dose.  No current changes needed.   Lab Results  Component Value Date   TSH 1.69 08/11/2017

## 2017-08-13 NOTE — Assessment & Plan Note (Addendum)
Annual comprehensive preventive exam was done as well as an evaluation and management of chronic conditions .  During the course of the visit the patient was educated and counseled about appropriate screening and preventive services including :  diabetes screening, lipid analysis with projected  10 year  risk for CAD , nutrition counseling, breast, cervical and colorectal cancer screening, and recommended immunizations.  Printed recommendations for health maintenance screenings was given  PAP normal Hep C/HIV normal

## 2017-08-18 ENCOUNTER — Other Ambulatory Visit: Payer: Self-pay | Admitting: Internal Medicine

## 2017-08-19 NOTE — Telephone Encounter (Signed)
Refilled: 07/12/2017 Last OV: 08/11/2017 Next OV: not scheduled

## 2017-09-16 ENCOUNTER — Ambulatory Visit: Payer: Self-pay

## 2017-09-16 NOTE — Telephone Encounter (Signed)
Pt. C/o one week history of headaches. Reports her BP x 2 days has been about 150/89. Has also had increased appetite. States her pharmacist told her she needs to have her thyroid blood work done again.

## 2017-09-16 NOTE — Telephone Encounter (Signed)
Spoke with patient she states she her appetite is double , her energy level up.  Blood pressure is elevated.

## 2017-09-16 NOTE — Telephone Encounter (Signed)
Can you place appointment slot for 10:00 on 09/20/17 ok per Dr Shirlee LatchMcLean Thanks

## 2017-09-16 NOTE — Telephone Encounter (Signed)
This needs appt   Thanks TMS

## 2017-09-19 NOTE — Telephone Encounter (Signed)
Patient wasn't added to schedule can we place on schedule for 4:30 , if so I need to advise patient

## 2017-09-19 NOTE — Telephone Encounter (Signed)
Looks like she is sch for 10 am 09/20/17  Thanks tMS

## 2017-09-20 ENCOUNTER — Ambulatory Visit: Payer: 59 | Admitting: Internal Medicine

## 2017-09-20 VITALS — BP 122/96 | HR 87 | Temp 98.1°F | Ht 63.0 in | Wt 119.6 lb

## 2017-09-20 DIAGNOSIS — G43009 Migraine without aura, not intractable, without status migrainosus: Secondary | ICD-10-CM | POA: Diagnosis not present

## 2017-09-20 DIAGNOSIS — R030 Elevated blood-pressure reading, without diagnosis of hypertension: Secondary | ICD-10-CM | POA: Diagnosis not present

## 2017-09-20 DIAGNOSIS — Z1231 Encounter for screening mammogram for malignant neoplasm of breast: Secondary | ICD-10-CM | POA: Diagnosis not present

## 2017-09-20 NOTE — Patient Instructions (Signed)
Please call back in 2 weeks with blood pressure readings or send a my chart message  Follow up in 1 month with PCP  Take care   Hypertension Hypertension, commonly called high blood pressure, is when the force of blood pumping through the arteries is too strong. The arteries are the blood vessels that carry blood from the heart throughout the body. Hypertension forces the heart to work harder to pump blood and may cause arteries to become narrow or stiff. Having untreated or uncontrolled hypertension can cause heart attacks, strokes, kidney disease, and other problems. A blood pressure reading consists of a higher number over a lower number. Ideally, your blood pressure should be below 120/80. The first ("top") number is called the systolic pressure. It is a measure of the pressure in your arteries as your heart beats. The second ("bottom") number is called the diastolic pressure. It is a measure of the pressure in your arteries as the heart relaxes. What are the causes? The cause of this condition is not known. What increases the risk? Some risk factors for high blood pressure are under your control. Others are not. Factors you can change  Smoking.  Having type 2 diabetes mellitus, high cholesterol, or both.  Not getting enough exercise or physical activity.  Being overweight.  Having too much fat, sugar, calories, or salt (sodium) in your diet.  Drinking too much alcohol.  Anxiety  Caffeine   Pain Factors that are difficult or impossible to change  Having chronic kidney disease.  Having a family history of high blood pressure.  Age. Risk increases with age.  Race. You may be at higher risk if you are African-American.  Gender. Men are at higher risk than women before age 61. After age 61, women are at higher risk than men.  Having obstructive sleep apnea.  Stress. What are the signs or symptoms? Extremely high blood pressure (hypertensive crisis) may  cause:  Headache.  Anxiety.  Shortness of breath.  Nosebleed.  Nausea and vomiting.  Severe chest pain.  Jerky movements you cannot control (seizures).  How is this diagnosed? This condition is diagnosed by measuring your blood pressure while you are seated, with your arm resting on a surface. The cuff of the blood pressure monitor will be placed directly against the skin of your upper arm at the level of your heart. It should be measured at least twice using the same arm. Certain conditions can cause a difference in blood pressure between your right and left arms. Certain factors can cause blood pressure readings to be lower or higher than normal (elevated) for a short period of time:  When your blood pressure is higher when you are in a health care provider's office than when you are at home, this is called white coat hypertension. Most people with this condition do not need medicines.  When your blood pressure is higher at home than when you are in a health care provider's office, this is called masked hypertension. Most people with this condition may need medicines to control blood pressure.  If you have a high blood pressure reading during one visit or you have normal blood pressure with other risk factors:  You may be asked to return on a different day to have your blood pressure checked again.  You may be asked to monitor your blood pressure at home for 1 week or longer.  If you are diagnosed with hypertension, you may have other blood or imaging tests to help your  health care provider understand your overall risk for other conditions. How is this treated? This condition is treated by making healthy lifestyle changes, such as eating healthy foods, exercising more, and reducing your alcohol intake. Your health care provider may prescribe medicine if lifestyle changes are not enough to get your blood pressure under control, and if:  Your systolic blood pressure is above  130.  Your diastolic blood pressure is above 80.  Your personal target blood pressure may vary depending on your medical conditions, your age, and other factors. Follow these instructions at home: Eating and drinking  Eat a diet that is high in fiber and potassium, and low in sodium, added sugar, and fat. An example eating plan is called the DASH (Dietary Approaches to Stop Hypertension) diet. To eat this way: ? Eat plenty of fresh fruits and vegetables. Try to fill half of your plate at each meal with fruits and vegetables. ? Eat whole grains, such as whole wheat pasta, brown rice, or whole grain bread. Fill about one quarter of your plate with whole grains. ? Eat or drink low-fat dairy products, such as skim milk or low-fat yogurt. ? Avoid fatty cuts of meat, processed or cured meats, and poultry with skin. Fill about one quarter of your plate with lean proteins, such as fish, chicken without skin, beans, eggs, and tofu. ? Avoid premade and processed foods. These tend to be higher in sodium, added sugar, and fat.  Reduce your daily sodium intake. Most people with hypertension should eat less than 1,500 mg of sodium a day.  Limit alcohol intake to no more than 1 drink a day for nonpregnant women and 2 drinks a day for men. One drink equals 12 oz of beer, 5 oz of wine, or 1 oz of hard liquor. Lifestyle  Work with your health care provider to maintain a healthy body weight or to lose weight. Ask what an ideal weight is for you.  Get at least 30 minutes of exercise that causes your heart to beat faster (aerobic exercise) most days of the week. Activities may include walking, swimming, or biking.  Include exercise to strengthen your muscles (resistance exercise), such as pilates or lifting weights, as part of your weekly exercise routine. Try to do these types of exercises for 30 minutes at least 3 days a week.  Do not use any products that contain nicotine or tobacco, such as cigarettes and  e-cigarettes. If you need help quitting, ask your health care provider.  Monitor your blood pressure at home as told by your health care provider.  Keep all follow-up visits as told by your health care provider. This is important. Medicines  Take over-the-counter and prescription medicines only as told by your health care provider. Follow directions carefully. Blood pressure medicines must be taken as prescribed.  Do not skip doses of blood pressure medicine. Doing this puts you at risk for problems and can make the medicine less effective.  Ask your health care provider about side effects or reactions to medicines that you should watch for. Contact a health care provider if:  You think you are having a reaction to a medicine you are taking.  You have headaches that keep coming back (recurring).  You feel dizzy.  You have swelling in your ankles.  You have trouble with your vision. Get help right away if:  You develop a severe headache or confusion.  You have unusual weakness or numbness.  You feel faint.  You have severe  pain in your chest or abdomen.  You vomit repeatedly.  You have trouble breathing. Summary  Hypertension is when the force of blood pumping through your arteries is too strong. If this condition is not controlled, it may put you at risk for serious complications.  Your personal target blood pressure may vary depending on your medical conditions, your age, and other factors. For most people, a normal blood pressure is less than 120/80.  Hypertension is treated with lifestyle changes, medicines, or a combination of both. Lifestyle changes include weight loss, eating a healthy, low-sodium diet, exercising more, and limiting alcohol. This information is not intended to replace advice given to you by your health care provider. Make sure you discuss any questions you have with your health care provider. Document Released: 08/16/2005 Document Revised: 07/14/2016  Document Reviewed: 07/14/2016 Elsevier Interactive Patient Education  [employer].

## 2017-09-21 ENCOUNTER — Other Ambulatory Visit: Payer: Self-pay | Admitting: Internal Medicine

## 2017-09-22 ENCOUNTER — Encounter: Payer: Self-pay | Admitting: Internal Medicine

## 2017-09-22 DIAGNOSIS — E039 Hypothyroidism, unspecified: Secondary | ICD-10-CM

## 2017-09-23 ENCOUNTER — Encounter: Payer: Self-pay | Admitting: Internal Medicine

## 2017-09-23 DIAGNOSIS — R030 Elevated blood-pressure reading, without diagnosis of hypertension: Secondary | ICD-10-CM | POA: Insufficient documentation

## 2017-09-23 NOTE — Progress Notes (Signed)
Chief Complaint  Patient presents with  . Headache   Follow up with husband  1. She reports at foot MD BP was elevated 150/90 on last Thursday. She has been having toe pain with ingrown toenail and had procedure with podiatry. Blood pressure readings today 122/96 130/86 129/96, her machine 129/86.  She reports she has been drinking a lot of caffeine but will try to cut back. She does report increased anxiety 2/2 to toe procedure.  She thought thyroid medication if was a different manufacturer would cause BP to be elevated so she called her pharmacy.  Reviewed side effects of thyroid medication HTN is one but pt has been on this same dose/brand. She denies increased sodium intake.   2 h/o migraines.  Taking 1/3rd pill imitrex getting h/as almost every 2-3 weeks. She has had her eyes checked. She is sleeping 7 hrs at night. She was not sure if h/as related to sinus issues vs chronic migraine issues today 2-3/10. Distribution of h/a normally is right parietal w/o vision changes. Today she does not have sinus pain. For migraine prevention she has tried BB in the past per chart review (toprol XL 25 and Bystolic 2.5 mg qd) but she did not like the way made her feel.   3. Toe pain 2/2 ingrown toenail seeing podiatry 5/10 she has been taking Advil.   4. C/o years ago she did cologuard and did not hear back from result for a while and when did told negative but she is not sure. Signed release to get cologuard report again if cant find in chart review. Per chart review was negative 09/16/15    Review of Systems  Constitutional: Negative for weight loss.  HENT: Negative for hearing loss.   Eyes:       No vision problems   Respiratory: Negative for shortness of breath.   Cardiovascular: Negative for chest pain and palpitations.  Gastrointestinal: Negative for abdominal pain.  Musculoskeletal:       +toe pain 2/2 ingrown toenail  Skin: Negative for rash.  Neurological: Positive for headaches.   Psychiatric/Behavioral: The patient is nervous/anxious.    Past Medical History:  Diagnosis Date  . Allergy   . Anemia   . Asthma   . Fibrocystic breast disease   . History of migraine headaches   . Hyperlipidemia   . Thyroid disease   . Vitamin D deficiency    Past Surgical History:  Procedure Laterality Date  . APPENDECTOMY  1967   secondary to rupture  . BREAST CYST ASPIRATION Left 2009  . chin implant  1980s  . FRONTALIS SUSPENSION  2008  . HERNIA REPAIR  2008   Family History  Problem Relation Age of Onset  . Stroke Mother 37  . Cancer Mother        unknown tyoe  . Osteoporosis Mother   . Breast cancer Mother 73  . Heart disease Father 68       AMI, died at 82 from second MI  . Alcohol abuse Brother    Social History   Socioeconomic History  . Marital status: Married    Spouse name: Not on file  . Number of children: Not on file  . Years of education: Not on file  . Highest education level: Not on file  Social Needs  . Financial resource strain: Not on file  . Food insecurity - worry: Not on file  . Food insecurity - inability: Not on file  . Transportation needs - medical:  Not on file  . Transportation needs - non-medical: Not on file  Occupational History  . Not on file  Tobacco Use  . Smoking status: Never Smoker  . Smokeless tobacco: Never Used  Substance and Sexual Activity  . Alcohol use: No  . Drug use: No  . Sexual activity: Not on file  Other Topics Concern  . Not on file  Social History Narrative  . Not on file   Current Meds  Medication Sig  . Aspirin-Acetaminophen-Caffeine (EXCEDRIN PO) Take by mouth daily as needed   . Calcipotriene-Betameth Diprop (ENSTILAR) 0.005-0.064 % FOAM Apply 1 application topically daily.  . Cholecalciferol (VITAMIN D) 2000 UNITS CAPS Take one by mouth daily   . SUMAtriptan (IMITREX) 100 MG tablet TAKE ONE-HALF TABLET BY MOUTH AT ONSET THEN ONE TABLET IN TWO HOURS IF NEEDED.  Marland Kitchen SYNTHROID 75 MCG tablet  TAKE 1 TABLET BY MOUTH DAILY  . [DISCONTINUED] SUMAtriptan (IMITREX) 100 MG tablet TAKE 1/2 TABLET BY MOUTH AT ONSET OF MIGRAINE, THEN 1 TABLET 2 HOURS LATER IFNEEDED   Allergies  Allergen Reactions  . Penicillins Rash  . Sulfa Antibiotics Rash   Recent Results (from the past 2160 hour(s))  Cytology - PAP     Status: None   Collection Time: 08/11/17 12:00 AM  Result Value Ref Range   Adequacy      Satisfactory for evaluation  endocervical/transformation zone component PRESENT.   Diagnosis      NEGATIVE FOR INTRAEPITHELIAL LESIONS OR MALIGNANCY.   HPV NOT DETECTED     Comment: Normal Reference Range - NOT Detected   Material Submitted CervicoVaginal Pap [ThinPrep Imaged]    CYTOLOGY - PAP PAP RESULT   Lipid panel     Status: None   Collection Time: 08/11/17 10:53 AM  Result Value Ref Range   Cholesterol 167 0 - 200 mg/dL    Comment: ATP III Classification       Desirable:  < 200 mg/dL               Borderline High:  200 - 239 mg/dL          High:  > = 604 mg/dL   Triglycerides 54.0 0.0 - 149.0 mg/dL    Comment: Normal:  <981 mg/dLBorderline High:  150 - 199 mg/dL   HDL 19.14 >78.29 mg/dL   VLDL 8.8 0.0 - 56.2 mg/dL   LDL Cholesterol 79 0 - 99 mg/dL   Total CHOL/HDL Ratio 2     Comment:                Men          Women1/2 Average Risk     3.4          3.3Average Risk          5.0          4.42X Average Risk          9.6          7.13X Average Risk          15.0          11.0                       NonHDL 87.47     Comment: NOTE:  Non-HDL goal should be 30 mg/dL higher than patient's LDL goal (i.e. LDL goal of < 70 mg/dL, would have non-HDL goal of < 100 mg/dL)  Comprehensive metabolic panel  Status: None   Collection Time: 08/11/17 10:53 AM  Result Value Ref Range   Sodium 139 135 - 145 mEq/L   Potassium 4.0 3.5 - 5.1 mEq/L   Chloride 102 96 - 112 mEq/L   CO2 32 19 - 32 mEq/L   Glucose, Bld 78 70 - 99 mg/dL   BUN 15 6 - 23 mg/dL   Creatinine, Ser 1.61 0.40 - 1.20 mg/dL    Total Bilirubin 0.7 0.2 - 1.2 mg/dL   Alkaline Phosphatase 54 39 - 117 U/L   AST 19 0 - 37 U/L   ALT 13 0 - 35 U/L   Total Protein 7.2 6.0 - 8.3 g/dL   Albumin 4.5 3.5 - 5.2 g/dL   Calcium 9.5 8.4 - 09.6 mg/dL   GFR 04.54 >09.81 mL/min  TSH     Status: None   Collection Time: 08/11/17 10:53 AM  Result Value Ref Range   TSH 1.69 0.35 - 4.50 uIU/mL  VITAMIN D 25 Hydroxy (Vit-D Deficiency, Fractures)     Status: None   Collection Time: 08/11/17 10:53 AM  Result Value Ref Range   VITD 55.13 30.00 - 100.00 ng/mL  HIV antibody     Status: None   Collection Time: 08/11/17 11:10 AM  Result Value Ref Range   HIV 1&2 Ab, 4th Generation NON-REACTIVE NON-REACTI    Comment: HIV-1 antigen and HIV-1/HIV-2 antibodies were not detected. There is no laboratory evidence of HIV infection. Marland Kitchen PLEASE NOTE: This information has been disclosed to you from records whose confidentiality may be protected by state law.  If your state requires such protection, then the state law prohibits you from making any further disclosure of the information without the specific written consent of the person to whom it pertains, or as otherwise permitted by law. A general authorization for the release of medical or other information is NOT sufficient for this purpose. . For additional information please refer to http://education.questdiagn[employer].com/faq/FAQ106 (This link is being provided for informational/ educational purposes only.) . Marland Kitchen The performance of this assay has not been clinically validated in patients less than 73 years old. .   Hepatitis C antibody     Status: None   Collection Time: 08/11/17 11:10 AM  Result Value Ref Range   Hepatitis C Ab NON-REACTIVE NON-REACTI   SIGNAL TO CUT-OFF 0.02 <1.00   Objective  Body mass index is 21.19 kg/m. Wt Readings from Last 3 Encounters:  09/20/17 119 lb 9.6 oz (54.3 kg)  08/11/17 116 lb 3.2 oz (52.7 kg)  08/09/16 117 lb (53.1 kg)   Temp Readings from  Last 3 Encounters:  09/20/17 98.1 F (36.7 C)  08/11/17 98.2 F (36.8 C) (Oral)  08/09/16 97.8 F (36.6 C) (Oral)   BP Readings from Last 3 Encounters:  09/20/17 (!) 122/96  08/11/17 126/74  08/09/16 110/72   Pulse Readings from Last 3 Encounters:  09/20/17 87  08/11/17 77  08/09/16 77   O2 sat 96% room air  Physical Exam  Constitutional: She is oriented to person, place, and time and well-developed, well-nourished, and in no distress.  No sinus ttp throughout  HENT:  Head: Normocephalic and atraumatic.  Mouth/Throat: Oropharynx is clear and moist and mucous membranes are normal.  Eyes: Conjunctivae are normal. Pupils are equal, round, and reactive to light.  Cardiovascular: Normal rate, regular rhythm and normal heart sounds.  Pulmonary/Chest: Effort normal and breath sounds normal.  Neurological: She is alert and oriented to person, place, and time. Gait normal. Gait  normal.  CN 2-12 grossly intact  Nl strength upper and lower ext b/l   Skin: Skin is warm and dry.  Psychiatric: Mood, memory, affect and judgment normal.  Nursing note and vitals reviewed.   Assessment   1. Elevated BP w/o h/o HTN could be 2/2 increased caffeine intake, anxiety, pain, new dx HTN also in differential  2. H/o migraines with mild h/a today  3. Ingrown toenail  4. HM Plan  1. Reduce caffeine intake  Hold NSAID use for pain 2/2 cause HTN  Log BP and call in 2 weeks if elevated and will consider w/either BB (I.e propranolol) or low dose CCB Norvasc  F/u in 1 month PCP 2. cont meds  3. F/u podiatry  4.  Had flu shot  Had Tdap  Consider check hep B status in future and shingrix vaccine   cologaurd neg 09/16/15 negative  mammo neg 12/14/16 referred for f/u this year  Pap 08/11/17 neg neg HPV  Will need CBC, UA in future Provider: Dr. French Anaracy [facility]

## 2017-09-26 ENCOUNTER — Other Ambulatory Visit (INDEPENDENT_AMBULATORY_CARE_PROVIDER_SITE_OTHER): Payer: 59

## 2017-09-26 DIAGNOSIS — E039 Hypothyroidism, unspecified: Secondary | ICD-10-CM | POA: Diagnosis not present

## 2017-09-26 LAB — TSH: TSH: 7.85 u[IU]/mL — AB (ref 0.35–4.50)

## 2017-09-27 ENCOUNTER — Encounter: Payer: Self-pay | Admitting: Internal Medicine

## 2017-09-28 ENCOUNTER — Telehealth: Payer: Self-pay | Admitting: Internal Medicine

## 2017-09-28 ENCOUNTER — Ambulatory Visit: Payer: 59 | Admitting: Internal Medicine

## 2017-09-28 ENCOUNTER — Encounter: Payer: Self-pay | Admitting: Internal Medicine

## 2017-09-28 VITALS — BP 140/88 | HR 98 | Temp 98.6°F | Resp 16 | Ht 63.0 in | Wt 121.8 lb

## 2017-09-28 DIAGNOSIS — R030 Elevated blood-pressure reading, without diagnosis of hypertension: Secondary | ICD-10-CM | POA: Diagnosis not present

## 2017-09-28 DIAGNOSIS — E039 Hypothyroidism, unspecified: Secondary | ICD-10-CM | POA: Diagnosis not present

## 2017-09-28 MED ORDER — TRAMADOL HCL 50 MG PO TABS: 50.0000 mg | ORAL_TABLET | Freq: Three times a day (TID) | ORAL | 0 refills | Status: DC | PRN

## 2017-09-28 NOTE — Telephone Encounter (Signed)
Left message for patient to call office.  

## 2017-09-28 NOTE — Telephone Encounter (Signed)
Patient added to schedule voiced understanding to acute for thyroid issue only, short visit.

## 2017-09-28 NOTE — Telephone Encounter (Signed)
Please off ms Curl 1:15 today .  Stress that this is a BRIEF visit because SHE REQUESTED AN URGENT VISIT because of thyroid problems.

## 2017-09-28 NOTE — Patient Instructions (Addendum)
Stay on Synthroid 75 mcg    recheck TSH and Free T4 in 4 week  If the thyroid is  still underactive in 4 weeks we can either increae dose to 88 mcg or change to Tirosint medication    we do not start bp medication  Unless you are getting readings of 150 or higher .   No excedrin motrin or ALEVE . DO NOT RESUME ANY CAFFEINATED BEVERAGES  USE TRAMADOL IF TYLENOL NOT ENOUGH FOR PAIN CONTROL      Levothyroxine oral capsules What is this medicine? LEVOTHYROXINE (lee voe thye ROX een) is a thyroid hormone. This medicine can improve symptoms of thyroid deficiency such as slow speech, lack of energy, weight gain, hair loss, dry skin, and feeling cold. It also helps to treat goiter (an enlarged thyroid gland). It is also used to treat some kinds of thyroid cancer along with surgery and other medicines. This medicine may be used for other purposes; ask your health care provider or pharmacist if you have questions. COMMON BRAND NAME(S): Tirosint What should I tell my health care provider before I take this medicine? They need to know if you have any of these conditions: -angina -blood clotting problems -diabetes -dieting or on a weight loss program -fertility problems -heart disease -high levels of thyroid hormone -pituitary gland problem -previous heart attack -an unusual or allergic reaction to levothyroxine, thyroid hormones, other medicines, foods, dyes, or preservatives -pregnant or trying to get pregnant -breast-feeding How should I use this medicine? Take this medicine by mouth with a glass of water. It is best to take on an empty stomach, at least 30 minutes to one hour before breakfast. Avoid taking antacids containing aluminum or magnesium, simethicone, bile acid sequestrants, calcium carbonate, sodium polystyrene sulfonate, ferrous sulfate, and sucralfate within 4 hours of taking this medicine. Do not cut, crush or chew this medicine. Follow the directions on the prescription label.  Take at the same time each day. Do not take your medicine more often than directed. Talk to your pediatrician regarding the use of this medicine in children. While this drug may be prescribed for selected conditions, precautions do apply. Since the capsules cannot be crushed or placed in water, they may only be given to infants and children who are able to swallow an intact capsule. Overdosage: If you think you have taken too much of this medicine contact a poison control center or emergency room at once. NOTE: This medicine is only for you. Do not share this medicine with others. What if I miss a dose? If you miss a dose, take it as soon as you can. If it is almost time for your next dose, take only that dose. Do not take double or extra doses. What may interact with this medicine? -amiodarone -antacids -anti-thyroid medicines -calcium supplements -carbamazepine -cholestyramine -colestipol -digoxin -female hormones, including contraceptive or birth control pills -iron supplements -ketamine -liquid nutrition products like Ensure -medicines for colds and breathing difficulties -medicines for diabetes -medicines for mental depression -medicines or herbals used to decrease weight or appetite -phenobarbital or other barbiturate medications -phenytoin -prednisone or other corticosteroids -rifabutin -rifampin -simethicone -sodium polystyrene sulfonate -soy isoflavones -sucralfate -theophylline -warfarin This list may not describe all possible interactions. Give your health care provider a list of all the medicines, herbs, non-prescription drugs, or dietary supplements you use. Also tell them if you smoke, drink alcohol, or use illegal drugs. Some items may interact with your medicine. What should I watch for while using this  medicine? Do not switch brands of this medicine unless your health care professional agrees with the change. Ask questions if you are uncertain. You will need  regular exams and occasional blood tests to check the response to treatment. If you are receiving this medicine for an underactive thyroid, it may be several weeks before you notice an improvement. Check with your doctor or health care professional if your symptoms do not improve. It may be necessary for you to take this medicine for the rest of your life. Do not stop using this medicine unless your doctor or health care professional advises you to. This medicine can affect blood sugar levels. If you have diabetes, check your blood sugar as directed. You may lose some of your hair when you first start treatment. With time, this usually corrects itself. If you are going to have surgery, tell your doctor or health care professional that you are taking this medicine. What side effects may I notice from receiving this medicine? Side effects that you should report to your doctor or health care professional as soon as possible: -allergic reactions like skin rash, itching or hives, swelling of the face, lips, or tongue -chest pain -excessive sweating or intolerance to heat -fast or irregular heartbeat -nervousness -swelling of ankles, feet, or legs -tremors Side effects that usually do not require medical attention (report to your doctor or health care professional if they continue or are bothersome): -changes in appetite -changes in menstrual periods -diarrhea -hair loss -headache -trouble sleeping -weight loss This list may not describe all possible side effects. Call your doctor for medical advice about side effects. You may report side effects to FDA at 1-800-FDA-1088. Where should I keep my medicine? Keep out of the reach of children. Store at room temperature between 15 and 30 degrees C (59 and 86 degrees F). Protect from light and moisture. Keep container tightly closed. Throw away any unused medicine after the expiration date. NOTE: This sheet is a summary. It may not cover all possible  information. If you have questions about this medicine, talk to your doctor, pharmacist, or health care provider.  2018 Elsevier/Gold Standard (2015-09-18 09:44:55)

## 2017-09-28 NOTE — Progress Notes (Signed)
Subjective:  Patient ID: Caitlyn Gregory, female    DOB: 1955-09-08  Age: 62 y.o. MRN: [id number]  CC: The primary encounter diagnosis was Acquired hypothyroidism. A diagnosis of Elevated blood pressure reading was also pertinent to this visit.  HPI [name] presents for urgent work in (per patient request) for signs and symptoms of abnormal thyroid.  Patient has "not felt right since refilling her thyroid medication on Jan 8. The dose had not been changed.  However she felt that it was too potent so she stopped it for several days,  Then reduced her dose to 1/2 tablet 5 days ago.  She reports elevated blood  Pressure readings , increased anxiety , and increased appetite,  And increased thirst.    Patient very upset .     Has gained 5 lbs since dec 13 .  s  Refill history synthroid 75 mcg  Daily since Oct 2016.  Refilled on Jan 5 with same.     Outpatient Medications Prior to Visit  Medication Sig Dispense Refill  . Aspirin-Acetaminophen-Caffeine (EXCEDRIN PO) Take by mouth daily as needed     . Calcipotriene-Betameth Diprop (ENSTILAR) 0.005-0.064 % FOAM Apply 1 application topically daily.    . Cholecalciferol (VITAMIN D) 2000 UNITS CAPS Take one by mouth daily     . SUMAtriptan (IMITREX) 100 MG tablet TAKE ONE-HALF TABLET BY MOUTH AT ONSET THEN ONE TABLET IN TWO HOURS IF NEEDED. 9 tablet 2  . SUMAtriptan (IMITREX) 100 MG tablet TAKE 1/2 TABLET BY MOUTH AT ONSET OF MIGRAINE, THEN 1 TABLET 2 HOURS LATER INNEEDED 9 tablet 0  . SYNTHROID 75 MCG tablet TAKE 1 TABLET BY MOUTH DAILY 90 tablet 1   No facility-administered medications prior to visit.     Review of Systems;  Patient denies headache, fevers, malaise, unintentional weight loss, skin rash, eye pain, sinus congestion and sinus pain, sore throat, dysphagia,  hemoptysis , cough, dyspnea, wheezing, chest pain, palpitations, orthopnea, edema, abdominal pain, nausea, melena, diarrhea, constipation, flank pain, dysuria, hematuria, urinary   Frequency, nocturia, numbness, tingling, seizures,  Focal weakness, Loss of consciousness,  Tremor, insomnia, depression, anxiety, and suicidal ideation.      Objective:  BP 140/88 (BP Location: Left Arm, Patient Position: Sitting, Cuff Size: Normal)   Pulse 98   Temp 98.6 F (37 C) (Oral)   Resp 16   Ht 5\' 3"  (1.6 m)   Wt 121 lb 12.8 oz (55.2 kg)   SpO2 99%   BMI 21.58 kg/m   BP Readings from Last 3 Encounters:  09/28/17 140/88  09/20/17 (!) 122/96  08/11/17 126/74    Wt Readings from Last 3 Encounters:  09/28/17 121 lb 12.8 oz (55.2 kg)  09/20/17 119 lb 9.6 oz (54.3 kg)  08/11/17 116 lb 3.2 oz (52.7 kg)    General appearance: alert, cooperative and appears stated age Ears: normal TM's and external ear canals both ears Throat: lips, mucosa, and tongue normal; teeth and gums normal Neck: no adenopathy, no carotid bruit, supple, symmetrical, trachea midline and thyroid not enlarged, symmetric, no tenderness/mass/nodules Back: symmetric, no curvature. ROM normal. No CVA tenderness. Lungs: clear to auscultation bilaterally Heart: regular rate and rhythm, S1, S2 normal, no murmur, click, rub or gallop Abdomen: soft, non-tender; bowel sounds normal; no masses,  no organomegaly Pulses: 2+ and symmetric Skin: Skin color, texture, turgor normal. No rashes or lesions Lymph nodes: Cervical, supraclavicular, and axillary nodes normal.  Lab Results  Component Value Date   HGBA1C  4.9 12/28/2013   HGBA1C 5.2 03/28/2012    Lab Results  Component Value Date   CREATININE 0.75 08/11/2017   CREATININE 0.70 08/09/2016   CREATININE 0.67 01/16/2015    Lab Results  Component Value Date   WBC 5.4 08/09/2016   HGB 14.5 08/09/2016   HCT 42.7 08/09/2016   PLT 222.0 08/09/2016   GLUCOSE 78 08/11/2017   CHOL 167 08/11/2017   TRIG 44.0 08/11/2017   HDL 79.30 08/11/2017   LDLCALC 79 08/11/2017   ALT 13 08/11/2017   AST 19 08/11/2017   NA 139 08/11/2017   K 4.0 08/11/2017   CL 102  08/11/2017   CREATININE 0.75 08/11/2017   BUN 15 08/11/2017   CO2 32 08/11/2017   TSH 7.85 (H) 09/26/2017   HGBA1C 4.9 12/28/2013    No results found.  Assessment & Plan:   Problem List Items Addressed This Visit    Elevated blood pressure reading    Deferring treatment given current agitated/anxious state.        Hypothyroidism - Primary    Current TSH is difficult to interpret in light pf patient's dose reduction for the prior two weeks.  Paln is to continue 75 mcg with new bottle.  Recheck tsh in 4 weeks and if tsh is still elevated will increase dose to 88 mcg        Relevant Orders   TSH   T4, free     A total of 25 minutes of face to face time was spent with patient more than half of which was spent in counselling about the above mentioned conditions  and coordination of care   I am having Etha H. Tallon start on traMADol. I am also having her maintain her Vitamin D, Aspirin-Acetaminophen-Caffeine (EXCEDRIN PO), Calcipotriene-Betameth Diprop, SUMAtriptan, SYNTHROID, and SUMAtriptan.  Meds ordered this encounter  Medications  . traMADol (ULTRAM) 50 MG tablet    Sig: Take 1 tablet (50 mg total) by mouth every 8 (eight) hours as needed.    Dispense:  30 tablet    Refill:  0    There are no discontinued medications.  Follow-up: Return for THYROID LABS.   Sherlene Shamseresa L Shah Insley, MD

## 2017-10-01 NOTE — Assessment & Plan Note (Addendum)
Current TSH is difficult to interpret in light pf patient's dose reduction for the prior two weeks.  Paln is to continue 75 mcg with new bottle.  Recheck tsh in 4 weeks and if tsh is still elevated will increase dose to 88 mcg

## 2017-10-01 NOTE — Assessment & Plan Note (Signed)
Deferring treatment given current agitated/anxious state.

## 2017-10-04 ENCOUNTER — Telehealth: Payer: Self-pay

## 2017-10-04 NOTE — Telephone Encounter (Signed)
PA has been submitted and approved. Pt and pharmacy are aware.

## 2017-10-06 NOTE — Telephone Encounter (Signed)
Tramadol has been approved from 10/04/2017 - 01/02/2018.

## 2017-10-12 ENCOUNTER — Ambulatory Visit: Payer: 59 | Admitting: Internal Medicine

## 2017-10-20 ENCOUNTER — Other Ambulatory Visit: Payer: Self-pay | Admitting: Internal Medicine

## 2017-10-24 ENCOUNTER — Ambulatory Visit: Payer: 59 | Admitting: Internal Medicine

## 2017-10-27 ENCOUNTER — Other Ambulatory Visit (INDEPENDENT_AMBULATORY_CARE_PROVIDER_SITE_OTHER): Payer: 59

## 2017-10-27 DIAGNOSIS — E039 Hypothyroidism, unspecified: Secondary | ICD-10-CM

## 2017-10-27 LAB — T4, FREE: FREE T4: 0.9 ng/dL (ref 0.60–1.60)

## 2017-10-27 LAB — TSH: TSH: 1.11 u[IU]/mL (ref 0.35–4.50)

## 2017-11-21 ENCOUNTER — Other Ambulatory Visit: Payer: Self-pay | Admitting: Internal Medicine

## 2018-02-02 ENCOUNTER — Ambulatory Visit: Payer: 59 | Admitting: Internal Medicine

## 2018-02-02 ENCOUNTER — Encounter: Payer: Self-pay | Admitting: Internal Medicine

## 2018-02-02 VITALS — BP 128/76 | HR 91 | Temp 99.0°F | Ht 63.0 in | Wt 119.4 lb

## 2018-02-02 DIAGNOSIS — G43809 Other migraine, not intractable, without status migrainosus: Secondary | ICD-10-CM | POA: Diagnosis not present

## 2018-02-02 DIAGNOSIS — J029 Acute pharyngitis, unspecified: Secondary | ICD-10-CM | POA: Diagnosis not present

## 2018-02-02 LAB — POCT RAPID STREP A (OFFICE): Rapid Strep A Screen: NEGATIVE

## 2018-02-02 MED ORDER — SUMATRIPTAN SUCCINATE 100 MG PO TABS: ORAL_TABLET | ORAL | 11 refills | Status: DC

## 2018-02-02 MED ORDER — AZITHROMYCIN 250 MG PO TABS: ORAL_TABLET | ORAL | 0 refills | Status: DC

## 2018-02-02 NOTE — Progress Notes (Signed)
Chief Complaint  Patient presents with  . Sore Throat   Sick visit  C/o scratchy sore throat Sunday and Tuesday still with sx's advil helped and had chills in the pm took temp and was 50F co workers have been sick with fever and sore throat. She also had some aches which have resolved. She also has swollen lymph node left neck    Review of Systems  Constitutional: Positive for chills. Negative for fever.  HENT: Positive for sore throat. Negative for hearing loss.        +green phlegm   Respiratory: Negative for cough.   Cardiovascular: Negative for chest pain.  Musculoskeletal: Positive for neck pain.  Endo/Heme/Allergies: Positive for environmental allergies.   Past Medical History:  Diagnosis Date  . Allergy   . Anemia   . Asthma   . Fibrocystic breast disease   . History of migraine headaches   . Hyperlipidemia   . Thyroid disease   . Vitamin D deficiency    Past Surgical History:  Procedure Laterality Date  . APPENDECTOMY  1967   secondary to rupture  . BREAST CYST ASPIRATION Left 2009  . chin implant  1980s  . FRONTALIS SUSPENSION  2008  . HERNIA REPAIR  2008   Family History  Problem Relation Age of Onset  . Stroke Mother 1988  . Cancer Mother        unknown tyoe  . Osteoporosis Mother   . Breast cancer Mother 780  . Heart disease Father [id number]       AMI, died at 7062 from second MI  . Hypertension Father   . Alcohol abuse Brother   . Hypertension Brother    Social History   Socioeconomic History  . Marital status: Married    Spouse name: Not on file  . Number of children: Not on file  . Years of education: Not on file  . Highest education level: Not on file  Occupational History  . Not on file  Social Needs  . Financial resource strain: Not on file  . Food insecurity:    Worry: Not on file    Inability: Not on file  . Transportation needs:    Medical: Not on file    Non-medical: Not on file  Tobacco Use  . Smoking status: Never Smoker  . Smokeless  tobacco: Never Used  Substance and Sexual Activity  . Alcohol use: No  . Drug use: No  . Sexual activity: Not on file  Lifestyle  . Physical activity:    Days per week: Not on file    Minutes per session: Not on file  . Stress: Not on file  Relationships  . Social connections:    Talks on phone: Not on file    Gets together: Not on file    Attends religious service: Not on file    Active member of club or organization: Not on file    Attends meetings of clubs or organizations: Not on file    Relationship status: Not on file  . Intimate partner violence:    Fear of current or ex partner: Not on file    Emotionally abused: Not on file    Physically abused: Not on file    Forced sexual activity: Not on file  Other Topics Concern  . Not on file  Social History Narrative  . Not on file   Current Meds  Medication Sig  . Aspirin-Acetaminophen-Caffeine (EXCEDRIN PO) Take by mouth daily as needed   .  Calcipotriene-Betameth Diprop (ENSTILAR) 0.005-0.064 % FOAM Apply 1 application topically daily.  . Cholecalciferol (VITAMIN D) 2000 UNITS CAPS Take one by mouth daily   . SUMAtriptan (IMITREX) 100 MG tablet TAKE ONE-HALF TABLET BY MOUTH AT ONSET THEN ONE TABLET IN TWO HOURS IF NEEDED.  Marland Kitchen SUMAtriptan (IMITREX) 100 MG tablet TAKE 1/2 TABLET AT ONSET OF MIGRAINE THEN 1 TABLET 2 HOURS LATER IF NEEDED  . SYNTHROID 75 MCG tablet TAKE ONE TABLET BY MOUTH DAILY  . traMADol (ULTRAM) 50 MG tablet Take 1 tablet (50 mg total) by mouth every 8 (eight) hours as needed.   Allergies  Allergen Reactions  . Penicillins Rash  . Sulfa Antibiotics Rash  . Sulfasalazine Rash   Recent Results (from the past 2160 hour(s))  POCT rapid strep A     Status: Normal   Collection Time: 02/02/18  3:28 PM  Result Value Ref Range   Rapid Strep A Screen Negative Negative   Objective  Body mass index is 21.15 kg/m. Wt Readings from Last 3 Encounters:  02/02/18 119 lb 6.4 oz (54.2 kg)  09/28/17 121 lb 12.8 oz  (55.2 kg)  09/20/17 119 lb 9.6 oz (54.3 kg)   Temp Readings from Last 3 Encounters:  02/02/18 99 F (37.2 C) (Oral)  09/28/17 98.6 F (37 C) (Oral)  09/20/17 98.1 F (36.7 C)   BP Readings from Last 3 Encounters:  02/02/18 128/76  09/28/17 140/88  09/20/17 (!) 122/96   Pulse Readings from Last 3 Encounters:  02/02/18 91  09/28/17 98  09/20/17 87    Physical Exam  Constitutional: She is oriented to person, place, and time. Vital signs are normal. She appears well-developed and well-nourished. She is cooperative.  HENT:  Head: Normocephalic and atraumatic.  Mouth/Throat: Mucous membranes are normal. Posterior oropharyngeal erythema present. No oropharyngeal exudate or tonsillar abscesses.  Eyes: Pupils are equal, round, and reactive to light.  Cardiovascular: Normal rate, regular rhythm and normal heart sounds.  Pulmonary/Chest: Effort normal and breath sounds normal.  Lymphadenopathy:    She has cervical adenopathy.       Left cervical: Superficial cervical adenopathy present.  Neurological: She is alert and oriented to person, place, and time. Gait normal.  Skin: Skin is warm, dry and intact.  Psychiatric: She has a normal mood and affect. Her speech is normal and behavior is normal. Judgment and thought content normal. Cognition and memory are normal.  Nursing note and vitals reviewed.   Assessment   1. Sore throat could be bacterial vs viral strep neg with lymphadenopathy left s. Cervical 2. Migraines   Plan  1. Trial of zpack Nsaids, tylenol  cepacol  Warm salt gargles Call back in 1 week if not better  2. Requested 1 year supply imitrex  Provider: Dr. French Ana McLean-Scocuzza-[facility]

## 2018-02-02 NOTE — Patient Instructions (Addendum)
Call us next week if you are not better  Call us in 1 month or less if the lymph node is not resolved  Hope you feel better   Lymphadenopathy Lymphadenopathy refers to swollen or enlarged lymph glands, also called lymph nodes. Lymph glands are part of your body's defense (immune) system, which protects the body from infections, germs, and diseases. Lymph glands are found in many locations in your body, including the neck, underarm, and groin. Many things can cause lymph glands to become enlarged. When your immune system responds to germs, such as viruses or bacteria, infection-fighting cells and fluid build up. This causes the glands to grow in size. Usually, this is not something to worry about. The swelling and any soreness often go away without treatment. However, swollen lymph glands can also be caused by a number of diseases. Your health care provider may do various tests to help determine the cause. If the cause of your swollen lymph glands cannot be found, it is important to monitor your condition to make sure the swelling goes away. Follow these instructions at home: Watch your condition for any changes. The following actions may help to lessen any discomfort you are feeling:  Get plenty of rest.  Take medicines only as directed by your health care provider. Your health care provider may recommend over-the-counter medicines for pain.  Apply moist heat compresses to the site of swollen lymph nodes as directed by your health care provider. This can help reduce any pain.  Check your lymph nodes daily for any changes.  Keep all follow-up visits as directed by your health care provider. This is important.  Contact a health care provider if:  Your lymph nodes are still swollen after 2 weeks.  Your swelling increases or spreads to other areas.  Your lymph nodes are hard, seem fixed to the skin, or are growing rapidly.  Your skin over the lymph nodes is red and inflamed.  You have a  fever.  You have chills.  You have fatigue.  You develop a sore throat.  You have abdominal pain.  You have weight loss.  You have night sweats. Get help right away if:  You notice fluid leaking from the area of the enlarged lymph node.  You have severe pain in any area of your body.  You have chest pain.  You have shortness of breath. This information is not intended to replace advice given to you by your health care provider. Make sure you discuss any questions you have with your health care provider. Document Released: 05/25/2008 Document Revised: 01/22/2016 Document Reviewed: 03/21/2014 Elsevier Interactive Patient Education  2018 Elsevier Inc.  Sore Throat A sore throat is pain, burning, irritation, or scratchiness in the throat. When you have a sore throat, you may feel pain or tenderness in your throat when you swallow or talk. Many things can cause a sore throat, including:  An infection.  Seasonal allergies.  Dryness in the air.  Irritants, such as smoke or pollution.  Gastroesophageal reflux disease (GERD).  A tumor.  A sore throat is often the first sign of another sickness. It may happen with other symptoms, such as coughing, sneezing, fever, and swollen neck glands. Most sore throats go away without medical treatment. Follow these instructions at home:  Take over-the-counter medicines only as told by your health care provider.  Drink enough fluids to keep your urine clear or pale yellow.  Rest as needed.  To help with pain, try: ? Sipping warm  liquids, such as broth, herbal tea, or warm water. ? Eating or drinking cold or frozen liquids, such as frozen ice pops. ? Gargling with a salt-water mixture 3-4 times a day or as needed. To make a salt-water mixture, completely dissolve -1 tsp of salt in 1 cup of warm water. ? Sucking on hard candy or throat lozenges. ? Putting a cool-mist humidifier in your bedroom at night to moisten the air. ? Sitting  in the bathroom with the door closed for 5-10 minutes while you run hot water in the shower.  Do not use any tobacco products, such as cigarettes, chewing tobacco, and e-cigarettes. If you need help quitting, ask your health care provider. Contact a health care provider if:  You have a fever for more than 2-3 days.  You have symptoms that last (are persistent) for more than 2-3 days.  Your throat does not get better within 7 days.  You have a fever and your symptoms suddenly get worse. Get help right away if:  You have difficulty breathing.  You cannot swallow fluids, soft foods, or your saliva.  You have increased swelling in your throat or neck.  You have persistent nausea and vomiting. This information is not intended to replace advice given to you by your health care provider. Make sure you discuss any questions you have with your health care provider. Document Released: 09/23/2004 Document Revised: 04/11/2016 Document Reviewed: 06/06/2015 Elsevier Interactive Patient Education  [employer].

## 2018-02-10 ENCOUNTER — Telehealth: Payer: Self-pay | Admitting: Internal Medicine

## 2018-02-10 ENCOUNTER — Telehealth: Payer: Self-pay

## 2018-02-10 NOTE — Telephone Encounter (Signed)
PA for Imitrex nasal spray has been submitted on covermymeds.

## 2018-02-17 ENCOUNTER — Ambulatory Visit: Payer: Self-pay | Admitting: *Deleted

## 2018-02-17 NOTE — Telephone Encounter (Signed)
Pt reports palpitations x 1 1/2 to 2 weeks. States some days 10/hour, others infrequent. Brief, 2 seconds. States  Has had similar symptoms "When thyroid levels were off."  States palpitations do not increase with exertion "In fact they decrease with exercise, walking, yoga."  Also states decreases after eating. Reports is staying hydrated, very minimal caffeine intake, no alcohol. Seen 02/02/18 for sore throat, congestion, completed course of antibiotics. States mild minimally productive (clear mucous) cough remains. On synthroid 75mcg QD. Last TSH 10/27/17 (1.11) one prior on 09/26/17 (7.85.) NT spoke with Olegario MessierKathy at practice to secure appt for Monday. [facility] for 1130 Monday 02/20/18. Instructed pt to go to ED if symptoms worsen, palpitations occur more frequently, dizziness, Cp, sob occurs; verbalizes understanding. Reason for Disposition . History of hyperthyroidism or taking thyroid medication  Answer Assessment - Initial Assessment Questions 1. DESCRIPTION: "Please describe your heart rate or heart beat that you are having" (e.g., fast/slow, regular/irregular, skipped or extra beats, "palpitations")     10 an hour some days, infrequently others. 2. ONSET: "When did it start?" (Minutes, hours or days)      1 1/2-2  Weeks ago 3. DURATION: "How long does it last" (e.g., seconds, minutes, hours)     Few seconds "couple" 4. PATTERN "Does it come and go, or has it been constant since it started?"  "Does it get worse with exertion?"   "Are you feeling it now?"     No "gets better with exercising and after eating." 5. TAP: "Using your hand, can you tap out what you are feeling on a chair or table in front of you, so that I can hear?" (Note: not all patients can do this)       Not presently 6. HEART RATE: "Can you tell me your heart rate?" "How many beats in 15 seconds?"  (Note: not all patients can do this)       92 7. RECURRENT SYMPTOM: "Have you ever had this before?" If so, ask: "When was the  last time?" and "What happened that time?"      Thyroid levels "off" 8. CAUSE: "What do you think is causing the palpitations?"     Maybe thyroid 9. CARDIAC HISTORY: "Do you have any history of heart disease?" (e.g., heart attack, angina, bypass surgery, angioplasty, arrhythmia)       10. OTHER SYMPTOMS: "Do you have any other symptoms?" (e.g., dizziness, chest pain, sweating, difficulty breathing)       Some congestion (seen 02/02/18, completed ATB. fatigue, cough, minimally productive for clear mucous  Protocols used: HEART RATE AND HEARTBEAT QUESTIONS-A-AH

## 2018-02-20 ENCOUNTER — Ambulatory Visit: Payer: 59 | Admitting: Internal Medicine

## 2018-02-20 ENCOUNTER — Encounter: Payer: Self-pay | Admitting: Internal Medicine

## 2018-02-20 VITALS — BP 118/80 | HR 78 | Temp 98.2°F | Resp 16 | Ht 63.0 in | Wt 118.8 lb

## 2018-02-20 DIAGNOSIS — R002 Palpitations: Secondary | ICD-10-CM | POA: Diagnosis not present

## 2018-02-20 DIAGNOSIS — J309 Allergic rhinitis, unspecified: Secondary | ICD-10-CM

## 2018-02-20 LAB — TSH: TSH: 1.48 u[IU]/mL (ref 0.35–4.50)

## 2018-02-20 LAB — CBC WITH DIFFERENTIAL/PLATELET
Basophils Absolute: 0 10*3/uL (ref 0.0–0.1)
Basophils Relative: 0.8 % (ref 0.0–3.0)
EOS ABS: 0 10*3/uL (ref 0.0–0.7)
Eosinophils Relative: 1.1 % (ref 0.0–5.0)
HCT: 39.8 % (ref 36.0–46.0)
Hemoglobin: 14 g/dL (ref 12.0–15.0)
LYMPHS ABS: 1.1 10*3/uL (ref 0.7–4.0)
Lymphocytes Relative: 26.6 % (ref 12.0–46.0)
MCHC: 35.3 g/dL (ref 30.0–36.0)
MCV: 97.9 fl (ref 78.0–100.0)
MONO ABS: 0.3 10*3/uL (ref 0.1–1.0)
Monocytes Relative: 8.3 % (ref 3.0–12.0)
NEUTROS PCT: 63.2 % (ref 43.0–77.0)
Neutro Abs: 2.7 10*3/uL (ref 1.4–7.7)
PLATELETS: 236 10*3/uL (ref 150.0–400.0)
RBC: 4.07 Mil/uL (ref 3.87–5.11)
RDW: 12.1 % (ref 11.5–15.5)
WBC: 4.2 10*3/uL (ref 4.0–10.5)

## 2018-02-20 LAB — COMPREHENSIVE METABOLIC PANEL
ALT: 13 U/L (ref 0–35)
AST: 17 U/L (ref 0–37)
Albumin: 4.4 g/dL (ref 3.5–5.2)
Alkaline Phosphatase: 57 U/L (ref 39–117)
BUN: 16 mg/dL (ref 6–23)
CO2: 30 meq/L (ref 19–32)
Calcium: 9.6 mg/dL (ref 8.4–10.5)
Chloride: 102 mEq/L (ref 96–112)
Creatinine, Ser: 0.73 mg/dL (ref 0.40–1.20)
GFR: 86.06 mL/min (ref >60.00)
GLUCOSE: 88 mg/dL (ref 70–99)
Potassium: 4 mEq/L (ref 3.5–5.1)
SODIUM: 138 meq/L (ref 135–145)
Total Bilirubin: 0.7 mg/dL (ref 0.2–1.2)
Total Protein: 7.1 g/dL (ref 6.0–8.3)

## 2018-02-20 LAB — MAGNESIUM: Magnesium: 2.2 mg/dL (ref 1.5–2.5)

## 2018-02-20 MED ORDER — PROPRANOLOL HCL 10 MG PO TABS: 10.0000 mg | ORAL_TABLET | Freq: Three times a day (TID) | ORAL | 2 refills | Status: DC

## 2018-02-20 NOTE — Patient Instructions (Signed)
Go ahead and start the azelastine for your allergies   Keep using [facility] [employer] [facility]'s sinus rinse daily   You can use alcohol rinse in the bottle before and after use to keep it clean     Your EKG is fine.  You are having Premature atrial or ventricular contractions.  These are harmless and can be caused by many things (see below)   Propanolol  is a very short acting low dose beta blocker that you  can use if the symptoms become persistent (over an hour )   Premature Atrial Contraction A premature atrial contraction [facility]) is a kind of irregular heartbeat (arrhythmia). It happens when the heart beats too early and then pauses before beating again. PACs are also called skipped heartbeats because they may make you feel like your heart is stopping for a second, even though the heart does not actually skip a beat. The heart has four areas, or chambers. Normally, electrical signals spread across the heart and make all the chambers beat together. During a PAC, the upper chambers of the heart (right atrium and left atrium) beat too early, before they have had time to fill with blood. The heartbeat pauses afterward so the heart can fill with blood for the next beat. What are the causes? The cause of this condition is often unknown. Sometimes it is caused by heart disease or injury to the heart. What increases the risk? This condition is more likely to develop in adults who are 25 years of age or older and in children. Episodes may be triggered by:  Caffeine.  Stress.  Tiredness.  Alcohol.  Smoking.  Stimulant drugs.  Heart disease.  What are the signs or symptoms? Symptoms of this condition include:  A feeling that your heart skipped a beat. The first heartbeat after the "skipped" beat may feel more forceful.  A feeling that your heart is fluttering.  How is this diagnosed? This condition is diagnosed based on:  Your symptoms.  A physical exam. Your health care provider may listen to  your heart.  Tests to rule out other conditions, such as a test that records the electrical impulses of the heart and assesses heart health (electrocardiogram, or ECG). If you have an ECG, you may need to wear a portable ECG machine (Holter monitor) that records your heart beats for 24 hours or more.  How is this treated?  Usually, treatment is not needed for this condition. If you have episodes that happen often or if a cause is found, you may receive treatment for the underlying cause of your PACs. Follow these instructions at home: Lifestyle Follow these instructions as told by your health care provider:  Do not use any products that contain nicotine or tobacco, such as cigarettes and e-cigarettes. If you need help quitting, ask your health care provider.  If caffeine triggers episodes, do not eat, drink, or use anything with caffeine in it.  If caffeine does not seem to trigger episodes, consume caffeine in moderation.  If alcohol triggers episodes of PAC, do not drink alcohol.  If alcohol does not seem to trigger episodes, limit alcohol intake to no more than 1 drink a day for nonpregnant women and 2 drinks a day for men. One drink equals 12 oz of beer, 5 oz of wine, or 1 oz of hard liquor.  Exercise regularly. Ask your health care provider what type of exercise is safe for you.  Find healthy ways to manage stress. Avoid stressful situations when possible.  Try to get at least 7-9 hours of sleep each night, or as much as recommended by your health care provider.  Do not use illegal drugs.  General instructions  Take over-the-counter and prescription medicines only as told by your health care provider.  Keep all follow-up visits as told by your health care provider. This is important. Contact a health care provider if:  You feel your heart skipping beats more than once a day.  Your heart skips beats and you feel dizzy, light-headed, or very tired. Get help right away  if:  You have chest pain.  You have trouble breathing. This information is not intended to replace advice given to you by your health care provider. Make sure you discuss any questions you have with your health care provider. Document Released: 04/19/2014 Document Revised: 04/13/2016 Document Reviewed: 02/13/2016 Elsevier Interactive Patient Education  [employer]2018 [employer] Inc.

## 2018-02-20 NOTE — Progress Notes (Signed)
Subjective:  Patient ID: Caitlyn Gregory, female    DOB: [birth year]  Age: 62 y.o. MRN: [id number]  CC: The primary encounter diagnosis was Palpitations. A diagnosis of Allergic rhinitis, unspecified seasonality, unspecified trigger was also pertinent to this visit.  HPI Caitlyn Gregory presents for new onset palpitations accompanied by dyspnea. And chest tightness .   Symptoms Have been occurring for the past 2 weeks .  last episode Friday morning,  While at rest ,  In the mornin.g.  Resolved spontaneously after less than an hour  No recurrence since then.  Does not occur during exercise /   Drinks decaf coffee  Taking 1/2 xyzal once daily (due to feeling dehydrated if she takes a whole one) and benadryl at night , still having having PND , had a frontal/ethmoid  sinus headache.  Takes a "bite " of sumatriptan tablet and headach is relieved within an hour . Took Mucinex D once last week ,  At least 24 hours  prior to onset of palpitations (heart skipping a beat)   Seen June 6 for sore throat  Cervical LAD>  Strep test negative. SYMPTOMS RESOLVED.    Seen in Jan by TMS for elevated BP .  TSH was high but  dose of thyroid was not increased to 88  .  Repeat TSH normal  Feb 28.   Prior trial of Bystolic for migraines not tolerated   Outpatient Medications Prior to Visit  Medication Sig Dispense Refill  . Aspirin-Acetaminophen-Caffeine (EXCEDRIN PO) Take by mouth daily as needed     . Calcipotriene-Betameth Diprop (ENSTILAR) 0.005-0.064 % FOAM Apply 1 application topically daily.    . Cholecalciferol (VITAMIN D) 2000 UNITS CAPS Take one by mouth daily     . SUMAtriptan (IMITREX) 100 MG tablet TAKE 1/2 TABLET AT ONSET OF MIGRAINE THEN 1 TABLET 2 HOURS LATER IF NEEDED. Max dose 200 mg in 1 day 10 tablet 11  . SYNTHROID 75 MCG tablet TAKE ONE TABLET BY MOUTH DAILY 90 tablet 1  . traMADol (ULTRAM) 50 MG tablet Take 1 tablet (50 mg total) by mouth every 8 (eight) hours as needed. 30 tablet 0  . azithromycin  (ZITHROMAX) 250 MG tablet 2 pills day 1 and 1 pill day 2-5 (Patient not taking: Reported on 02/20/2018) 6 tablet 0   No facility-administered medications prior to visit.     Review of Systems;  Patient denies headache, fevers, malaise, unintentional weight loss, skin rash, eye pain, sinus congestion and sinus pain, sore throat, dysphagia,  hemoptysis , cough, dyspnea, wheezing, chest pain, palpitations, orthopnea, edema, abdominal pain, nausea, melena, diarrhea, constipation, flank pain, dysuria, hematuria, urinary  Frequency, nocturia, numbness, tingling, seizures,  Focal weakness, Loss of consciousness,  Tremor, insomnia, depression, anxiety, and suicidal ideation.      Objective:  BP 118/80 (BP Location: Left Arm, Patient Position: Sitting, Cuff Size: Normal)   Pulse 78   Temp 98.2 F (36.8 C) (Oral)   Resp 16   Ht 5\' 3"  (1.6 m)   Wt 118 lb 12.8 oz (53.9 kg)   SpO2 98%   BMI 21.04 kg/m   BP Readings from Last 3 Encounters:  02/20/18 118/80  02/02/18 128/76  09/28/17 140/88    Wt Readings from Last 3 Encounters:  02/20/18 118 lb 12.8 oz (53.9 kg)  02/02/18 119 lb 6.4 oz (54.2 kg)  09/28/17 121 lb 12.8 oz (55.2 kg)    General appearance: alert, cooperative and appears stated age Ears: normal TM's and  external ear canals both ears Throat: lips, mucosa, and tongue normal; teeth and gums normal Neck: no adenopathy, no carotid bruit, supple, symmetrical, trachea midline and thyroid not enlarged, symmetric, no tenderness/mass/nodules Back: symmetric, no curvature. ROM normal. No CVA tenderness. Lungs: clear to auscultation bilaterally Heart: regular rate and rhythm, S1, S2 normal, no murmur, click, rub or gallop Abdomen: soft, non-tender; bowel sounds normal; no masses,  no organomegaly Pulses: 2+ and symmetric Skin: Skin color, texture, turgor normal. No rashes or lesions Lymph nodes: Cervical, supraclavicular, and axillary nodes normal.  Lab Results  Component Value Date     HGBA1C 4.9 12/28/2013   HGBA1C 5.2 03/28/2012    Lab Results  Component Value Date   CREATININE 0.73 02/20/2018   CREATININE 0.75 08/11/2017   CREATININE 0.70 08/09/2016    Lab Results  Component Value Date   WBC 4.2 02/20/2018   HGB 14.0 02/20/2018   HCT 39.8 02/20/2018   PLT 236.0 02/20/2018   GLUCOSE 88 02/20/2018   CHOL 167 08/11/2017   TRIG 44.0 08/11/2017   HDL 79.30 08/11/2017   LDLCALC 79 08/11/2017   ALT 13 02/20/2018   AST 17 02/20/2018   NA 138 02/20/2018   K 4.0 02/20/2018   CL 102 02/20/2018   CREATININE 0.73 02/20/2018   BUN 16 02/20/2018   CO2 30 02/20/2018   TSH 1.48 02/20/2018   HGBA1C 4.9 12/28/2013    No results found.  Assessment & Plan:   Problem List Items Addressed This Visit    Rhinitis, allergic    Continue zyzal , saline irrigation and and azelastine nasal spray      Palpitations - Primary    I have ordered and interpreted an EKG  today which appears to reflec normal sinus rhythm.  Her Heart exam is normal except for an occasional /infrequent PAC.  thyroid , lytes and cbc are normal. It is not clear if the chest tightness is temporally related or due to current allergy symptoms.  Will treat with prn inderal and refer to cardiology if sympoms become more distressing   Lab Results  Component Value Date   TSH 1.48 02/20/2018   Lab Results  Component Value Date   NA 138 02/20/2018   K 4.0 02/20/2018   CL 102 02/20/2018   CO2 30 02/20/2018   Lab Results  Component Value Date   WBC 4.2 02/20/2018   HGB 14.0 02/20/2018   HCT 39.8 02/20/2018   MCV 97.9 02/20/2018   PLT 236.0 02/20/2018        Relevant Orders   EKG 12-Lead (Completed)   TSH (Completed)   CBC with Differential/Platelet (Completed)   Comprehensive metabolic panel (Completed)   Magnesium (Completed)     A total of 25 minutes of face to face time was spent with patient more than half of which was spent in counselling about the above mentioned conditions  and  coordination of care  I have discontinued Ammanda H. Hirth's azithromycin. I am also having her start on propranolol. Additionally, I am having her maintain her Vitamin D, Aspirin-Acetaminophen-Caffeine (EXCEDRIN PO), Calcipotriene-Betameth Diprop, traMADol, SYNTHROID, and SUMAtriptan.  Meds ordered this encounter  Medications  . propranolol (INDERAL) 10 MG tablet    Sig: Take 1 tablet (10 mg total) by mouth 3 (three) times daily. As needed for palpitations    Dispense:  30 tablet    Refill:  2    Medications Discontinued During This Encounter  Medication Reason  . azithromycin (ZITHROMAX) 250 MG tablet  Completed Course    Follow-up: No follow-ups on file.   Sherlene Shams, MD

## 2018-02-21 DIAGNOSIS — R002 Palpitations: Secondary | ICD-10-CM | POA: Insufficient documentation

## 2018-02-21 NOTE — Assessment & Plan Note (Signed)
Continue zyzal , saline irrigation and and azelastine nasal spray

## 2018-02-21 NOTE — Assessment & Plan Note (Addendum)
I have ordered and interpreted an EKG  today which appears to reflec normal sinus rhythm.  Her Heart exam is normal except for an occasional /infrequent PAC.  thyroid , lytes and cbc are normal. It is not clear if the chest tightness is temporally related or due to current allergy symptoms.  Will treat with prn inderal and refer to cardiology if sympoms become more distressing   Lab Results  Component Value Date   TSH 1.48 02/20/2018   Lab Results  Component Value Date   NA 138 02/20/2018   K 4.0 02/20/2018   CL 102 02/20/2018   CO2 30 02/20/2018   Lab Results  Component Value Date   WBC 4.2 02/20/2018   HGB 14.0 02/20/2018   HCT 39.8 02/20/2018   MCV 97.9 02/20/2018   PLT 236.0 02/20/2018

## 2018-03-07 ENCOUNTER — Telehealth: Payer: Self-pay

## 2018-03-07 DIAGNOSIS — G43809 Other migraine, not intractable, without status migrainosus: Secondary | ICD-10-CM

## 2018-03-07 MED ORDER — SUMATRIPTAN SUCCINATE 100 MG PO TABS: ORAL_TABLET | ORAL | 11 refills | Status: DC

## 2018-03-07 NOTE — Telephone Encounter (Signed)
The last rx was written by [employer].  But I will re write if you you tell me the correct amount

## 2018-03-07 NOTE — Telephone Encounter (Signed)
Insurance will only approve 9 per month.

## 2018-03-07 NOTE — Telephone Encounter (Signed)
PA for Sumatriptan was denied by insurance because the rx calls for quantities higher than the allowed amount.

## 2018-03-07 NOTE — Telephone Encounter (Signed)
Corrected and sent to [facility]

## 2018-03-21 ENCOUNTER — Other Ambulatory Visit: Payer: Self-pay | Admitting: Internal Medicine

## 2018-04-18 NOTE — Progress Notes (Signed)
Cardiology Office Note  Date:  04/19/2018   ID:  Caitlyn Gregory, DOB 07/29/1956, MRN [id number]  PCP:  Caitlyn Gregory, Caitlyn L, MD   Chief Complaint  Patient presents with  . New Patient (Initial Visit)    Referred by Dr. Darrick Huntsmanullo for arrhythmias x 2 weeks. Meds reviewed verbally with patient.     HPI:  [name] is a 62 year old woman with past medical history of palpitations  dyspnea , asthma chest tightness .    H/A Hypothyroid Asthma anxiety Who presents by referral from Dr. Darrick Huntsmanullo for consultation of her heart arrhythmia palpitations  She reports having periodic history of palpitations Has been getting worse recently in the setting of stress over the past several months Notices the palpitations at different times of the day Describes them as isolated extra beats with a dropout before resuming normal rhythm Does not occur during exercise   Details described to Dr. Darrick Huntsmanullo and she recommended that she try propranolol s needed She's not tried the medication yet  Prior trial of Bystolic for migraines not tolerated   The patient attributes her symptoms to chronic Sinus drainage and stress Sx started in May  Otherwise active with no other complaints of shortness of breath or chest pain  Reports having remote history of anorexia when she was younger  She does have family history Father with CAD, MI, , mid 2340s  EKG personally reviewed by myself on todays visit Shows normal sinus rhythm rate 77 bpm no significant ST or T-wave changes   PMH:   has a past medical history of Allergy, Anemia, Asthma, Fibrocystic breast disease, History of migraine headaches, Hyperlipidemia, Thyroid disease, and Vitamin D deficiency.  PSH:    Past Surgical History:  Procedure Laterality Date  . APPENDECTOMY  1967   secondary to rupture  . BREAST CYST ASPIRATION Left 2009  . chin implant  1980s  . FRONTALIS SUSPENSION  2008  . HERNIA REPAIR  2008    Current Outpatient Medications  Medication Sig  Dispense Refill  . Aspirin-Acetaminophen-Caffeine (EXCEDRIN PO) Take by mouth daily as needed     . Calcipotriene-Betameth Diprop (ENSTILAR) 0.005-0.064 % FOAM Apply 1 application topically daily.    . Cholecalciferol (VITAMIN D) 2000 UNITS CAPS Take one by mouth daily     . propranolol (INDERAL) 10 MG tablet Take 1 tablet (10 mg total) by mouth 3 (three) times daily. As needed for palpitations 30 tablet 2  . SUMAtriptan (IMITREX) 100 MG tablet TAKE 1/2 TABLET AT ONSET OF MIGRAINE THEN 1 TABLET 2 HOURS LATER IF NEEDED. Max dose 200 mg in 1 day 9 tablet 11  . SYNTHROID 75 MCG tablet TAKE 1 TABLET DAILY 90 tablet 1  . traMADol (ULTRAM) 50 MG tablet Take 1 tablet (50 mg total) by mouth every 8 (eight) hours as needed. 30 tablet 0   No current facility-administered medications for this visit.      Allergies:   Penicillins; Sulfa antibiotics; and Sulfasalazine   Social History:  The patient  reports that she has never smoked. She has never used smokeless tobacco. She reports that she does not drink alcohol or use drugs.   Family History:   family history includes Alcohol abuse in her brother; Breast cancer (age of onset: 1380) in her mother; Cancer in her mother; Heart disease (age of onset: 10644) in her father; Hypertension in her brother and father; Osteoporosis in her mother; Stroke (age of onset: 2088) in her mother.    Review  of Systems: Review of Systems  Constitutional: Negative.   Respiratory: Negative.   Cardiovascular: Positive for palpitations.  Gastrointestinal: Negative.   Musculoskeletal: Negative.   Neurological: Negative.   Psychiatric/Behavioral: Negative.   All other systems reviewed and are negative.    PHYSICAL EXAM: VS:  BP 126/80 (BP Location: Right Arm, Patient Position: Sitting, Cuff Size: Normal)   Pulse 77   Ht 5\' 3"  (1.6 m)   Wt 119 lb 12 oz (54.3 kg)   BMI 21.21 kg/m  , BMI Body mass index is 21.21 kg/m. GEN: Well nourished, well developed, in no acute  distress  HEENT: normal  Neck: no JVD, carotid bruits, or masses Cardiac: RRR; no murmurs, rubs, or gallops,no edema  Respiratory:  clear to auscultation bilaterally, normal work of breathing GI: soft, nontender, nondistended, + BS MS: no deformity or atrophy  Skin: warm and dry, no rash Neuro:  Strength and sensation are intact Psych: euthymic mood, full affect   Recent Labs: 02/20/2018: ALT 13; BUN 16; Creatinine, Ser 0.73; Hemoglobin 14.0; Magnesium 2.2; Platelets 236.0; Potassium 4.0; Sodium 138; TSH 1.48    Lipid Panel Lab Results  Component Value Date   CHOL 167 08/11/2017   HDL 79.30 08/11/2017   LDLCALC 79 08/11/2017   TRIG 44.0 08/11/2017      Wt Readings from Last 3 Encounters:  04/19/18 119 lb 12 oz (54.3 kg)  02/20/18 118 lb 12.8 oz (53.9 kg)  02/02/18 119 lb 6.4 oz (54.2 kg)      ASSESSMENT AND PLAN:  Palpitations Highly having APCs or PVCs Long discussion with her concerning management We did offer a long-term monitor She has declined at this time Certainly could take propranolol as needed For worsening symptoms could try long-acting beta blocker She's not take the interested in medications at this time Certainly other stressors such a sinus drainage or home stress could contribute to her symptoms No other workup needed at this time apart from monitor if symptoms get worse  Chronic fatigue Recommended regular exercise program for stress reduction  Hypothyroidism due to acquired atrophy of thyroid Managed by primary care  Migraine without aura and without status migrainosus, not intractable Managed by primary care  Disposition:   F/U as needed  Patient was seen in consultation for Dr. Darrick Huntsmanullo and will be referred back to her office for ongoing care of the issues detailed above   Total encounter time more than 60 minutes  Greater than 50% was spent in counseling and coordination of care with the patient   No orders of the defined types were  placed in this encounter.    Signed, Caitlyn Gregory, M.D., Ph.D. 04/19/2018  [facility] [place] [phone]

## 2018-04-19 ENCOUNTER — Ambulatory Visit: Payer: 59 | Admitting: Cardiovascular Disease

## 2018-04-19 ENCOUNTER — Encounter

## 2018-04-19 ENCOUNTER — Encounter: Payer: Self-pay | Admitting: Cardiovascular Disease

## 2018-04-19 VITALS — BP 126/80 | HR 77 | Ht 63.0 in | Wt 119.8 lb

## 2018-04-19 DIAGNOSIS — G43009 Migraine without aura, not intractable, without status migrainosus: Secondary | ICD-10-CM | POA: Diagnosis not present

## 2018-04-19 DIAGNOSIS — R5382 Chronic fatigue, unspecified: Secondary | ICD-10-CM

## 2018-04-19 DIAGNOSIS — R002 Palpitations: Secondary | ICD-10-CM | POA: Diagnosis not present

## 2018-04-19 DIAGNOSIS — E034 Atrophy of thyroid (acquired): Secondary | ICD-10-CM

## 2018-04-19 NOTE — Patient Instructions (Signed)
Medication Instructions:   No medication changes made  Labwork:  No new labs needed  Testing/Procedures:  No further testing at this time  Please call if you would like a ZIO patch, 2 weeks monitor   Follow-Up: It was a pleasure seeing you in the office today. Please call us if you have new issues that need to be addressed before your next appt.  [phone]  Your physician wants you to follow-up in:  As needed  If you need a refill on your cardiac medications before your next appointment, please call your pharmacy.  For educational health videos Log in to : [email or web address] Or : [email or web address], password : [email or web address]

## 2018-08-14 ENCOUNTER — Encounter: Payer: Self-pay | Admitting: Internal Medicine

## 2018-08-14 ENCOUNTER — Ambulatory Visit (INDEPENDENT_AMBULATORY_CARE_PROVIDER_SITE_OTHER): Payer: 59 | Admitting: Internal Medicine

## 2018-08-14 VITALS — BP 114/74 | HR 84 | Temp 98.0°F | Wt 122.0 lb

## 2018-08-14 DIAGNOSIS — G43009 Migraine without aura, not intractable, without status migrainosus: Secondary | ICD-10-CM | POA: Diagnosis not present

## 2018-08-14 DIAGNOSIS — J309 Allergic rhinitis, unspecified: Secondary | ICD-10-CM | POA: Diagnosis not present

## 2018-08-14 DIAGNOSIS — Z1239 Encounter for other screening for malignant neoplasm of breast: Secondary | ICD-10-CM

## 2018-08-14 DIAGNOSIS — Z0000 Encounter for general adult medical examination without abnormal findings: Secondary | ICD-10-CM

## 2018-08-14 MED ORDER — ZOSTER VAC RECOMB ADJUVANTED 50 MCG/0.5ML IM SUSR: 0.5000 mL | Freq: Once | INTRAMUSCULAR | 1 refills | Status: AC

## 2018-08-14 MED ORDER — MONTELUKAST SODIUM 10 MG PO TABS: 10.0000 mg | ORAL_TABLET | Freq: Every day | ORAL | 3 refills | Status: DC

## 2018-08-14 MED ORDER — IPRATROPIUM BROMIDE 0.03 % NA SOLN: 2.0000 | Freq: Two times a day (BID) | NASAL | 12 refills | Status: DC

## 2018-08-14 NOTE — Progress Notes (Signed)
Patient ID: Caitlyn Gregory, female    DOB: 1956-03-17  Age: 62 y.o. MRN: [id number]  The patient is here for annual preventive  examination and management of other chronic and acute problems.   The risk factors are reflected in the social history.  The roster of all physicians providing medical care to patient - is listed in the Snapshot section of the chart.  Activities of daily living:  The patient is 100% independent in all ADLs: dressing, toileting, feeding as well as independent mobility  Home safety : The patient has smoke detectors in the home. They wear seatbelts.  There are no firearms at home. There is no violence in the home.   There is no risks for hepatitis, STDs or HIV. There is no   history of blood transfusion. They have no travel history to infectious disease endemic areas of the world.  The patient has seen their dentist in the last six month. They have seen their eye doctor in the last year. They deny  hearing difficulty with regard to whispered voices and some television programs.  They have deferred audiologic testing in the last year.  They do not  have excessive sun exposure. Discussed the need for sun protection: hats, long sleeves and use of sunscreen if there is significant sun exposure.   Diet: the importance of a healthy diet is discussed. They do have a healthy diet.  The benefits of regular aerobic exercise were discussed. She walks 4 times per week ,  20 minutes.   Depression screen: there are no signs or vegative symptoms of depression- irritability, change in appetite, anhedonia, sadness/tearfullness.  Cognitive assessment: the patient manages all their financial and personal affairs and is actively engaged. They could relate day,date,year and events; recalled 2/3 objects at 3 minutes; performed clock-face test normally.  The following portions of the patient's history were reviewed and updated as appropriate: allergies, current medications, past family history, past  medical history,  past surgical history, past social history  and problem list.  Visual acuity was not assessed per patient preference since she has regular follow up with her ophthalmologist. Hearing and body mass index were assessed and reviewed.   During the course of the visit the patient was educated and counseled about appropriate screening and preventive services including : fall prevention , diabetes screening, nutrition counseling, colorectal cancer screening, and recommended immunizations.    CC: The primary encounter diagnosis was Breast cancer screening. Diagnoses of Allergic rhinitis, unspecified seasonality, unspecified trigger, Migraine without aura and without status migrainosus, not intractable, and Visit for preventive health examination were also pertinent to this visit.  1) palpitations: referred to [facility],  Declined L/T monitor and meds have not been reucurrrent since stopping xyzal  2) chronic morning PND with cough . Xyzal raised BP to 130-140 .  Taking claritin childrens  No more headaches,  But still draining and feeling congested.   using a HEPA filter but has not had vents cleaned   History Caitlyn Gregory has a past medical history of Allergy, Anemia, Asthma, Fibrocystic breast disease, History of migraine headaches, Hyperlipidemia, Thyroid disease, and Vitamin D deficiency.   She has a past surgical history that includes chin implant (1980s); Appendectomy (1967); Frontalis suspension (2008); Hernia repair (2008); and Breast cyst aspiration (Left, 2009).   Her family history includes Alcohol abuse in her brother; Breast cancer (age of onset: 65) in her mother; Cancer in her mother; Heart disease (age of onset: 10) in her father; Hypertension in her  brother and father; Osteoporosis in her mother; Stroke (age of onset: 40) in her mother.She reports that she has never smoked. She has never used smokeless tobacco. She reports that she does not drink alcohol or use drugs.  Outpatient  Medications Prior to Visit  Medication Sig Dispense Refill  . Aspirin-Acetaminophen-Caffeine (EXCEDRIN PO) Take by mouth daily as needed     . Calcipotriene-Betameth Diprop (ENSTILAR) 0.005-0.064 % FOAM Apply 1 application topically daily.    . Cholecalciferol (VITAMIN D) 2000 UNITS CAPS Take one by mouth daily     . loratadine (CLARITIN) 10 MG tablet Take 10 mg by mouth daily.    . SUMAtriptan (IMITREX) 100 MG tablet TAKE 1/2 TABLET AT ONSET OF MIGRAINE THEN 1 TABLET 2 HOURS LATER IF NEEDED. Max dose 200 mg in 1 day 9 tablet 11  . SYNTHROID 75 MCG tablet TAKE 1 TABLET DAILY 90 tablet 1  . propranolol (INDERAL) 10 MG tablet Take 1 tablet (10 mg total) by mouth 3 (three) times daily. As needed for palpitations (Patient not taking: Reported on 08/14/2018) 30 tablet 2  . traMADol (ULTRAM) 50 MG tablet Take 1 tablet (50 mg total) by mouth every 8 (eight) hours as needed. (Patient not taking: Reported on 08/14/2018) 30 tablet 0   No facility-administered medications prior to visit.     Review of Systems   Patient denies headache, fevers, malaise, unintentional weight loss, skin rash, eye pain, sinus congestion and sinus pain, sore throat, dysphagia,  hemoptysis , cough, dyspnea, wheezing, chest pain, palpitations, orthopnea, edema, abdominal pain, nausea, melena, diarrhea, constipation, flank pain, dysuria, hematuria, urinary  Frequency, nocturia, numbness, tingling, seizures,  Focal weakness, Loss of consciousness,  Tremor, insomnia, depression, anxiety, and suicidal ideation.      Objective:  BP 114/74 (BP Location: Right Arm, Patient Position: Sitting, Cuff Size: Normal)   Pulse 84   Temp 98 F (36.7 C)   Wt 122 lb (55.3 kg)   SpO2 97%   BMI 21.61 kg/m   Physical Exam   General appearance: alert, cooperative and appears stated age Head: Normocephalic, without obvious abnormality, atraumatic Eyes: conjunctivae/corneas clear. PERRL, EOM's intact. Fundi benign. Ears: normal TM's and  external ear canals both ears Nose: Nares normal. Septum midline. Mucosa normal. No drainage or sinus tenderness. Throat: lips, mucosa, and tongue normal; teeth and gums normal Neck: no adenopathy, no carotid bruit, no JVD, supple, symmetrical, trachea midline and thyroid not enlarged, symmetric, no tenderness/mass/nodules Lungs: clear to auscultation bilaterally Breasts: normal appearance, no masses or tenderness Heart: regular rate and rhythm, S1, S2 normal, no murmur, click, rub or gallop Abdomen: soft, non-tender; bowel sounds normal; no masses,  no organomegaly Extremities: extremities normal, atraumatic, no cyanosis or edema Pulses: 2+ and symmetric Skin: Skin color, texture, turgor normal. No rashes or lesions Neurologic: Alert and oriented X 3, normal strength and tone. Normal symmetric reflexes. Normal coordination and gait.      Assessment & Plan:   Problem List Items Addressed This Visit    Migraine headache    Resolved since stopping Xyzal      Rhinitis, allergic    Therapy  using  OCT meds  (anticholinergics  Steroids,  antihistamines)  outlined       Visit for preventive health examination    Annual comprehensive preventive exam was done as well as an evaluation and management of chronic conditions .  During the course of the visit the patient was educated and counseled about appropriate screening and preventive services including :  diabetes screening, lipid analysis with projected  10 year  risk for CAD  Which is 4.7 % using the Framingham risk calculator for women, , nutrition counseling, colorectal cancer screening, and recommended immunizations.  Printed recommendations for health maintenance screenings was given.   Lab Results  Component Value Date   CHOL 167 08/11/2017   HDL 79.30 08/11/2017   LDLCALC 79 08/11/2017   TRIG 44.0 08/11/2017   CHOLHDL 2 08/11/2017          Other Visit Diagnoses    Breast cancer screening    -  Primary   Relevant Orders    MM 3D SCREEN BREAST BILATERAL      I have discontinued Caitlyn Gregory's traMADol. I am also having her start on ipratropium, montelukast, and Zoster Vaccine Adjuvanted. Additionally, I am having her maintain her Vitamin D, Aspirin-Acetaminophen-Caffeine (EXCEDRIN PO), Calcipotriene-Betameth Diprop, propranolol, SUMAtriptan, SYNTHROID, and loratadine.  Meds ordered this encounter  Medications  . ipratropium (ATROVENT) 0.03 % nasal spray    Sig: Place 2 sprays into both nostrils every 12 (twelve) hours.    Dispense:  30 mL    Refill:  12  . montelukast (SINGULAIR) 10 MG tablet    Sig: Take 1 tablet (10 mg total) by mouth at bedtime.    Dispense:  30 tablet    Refill:  3  . Zoster Vaccine Adjuvanted [facility](SHINGRIX) injection    Sig: Inject 0.5 mLs into the muscle once for 1 dose.    Dispense:  1 each    Refill:  1    Medications Discontinued During This Encounter  Medication Reason  . traMADol (ULTRAM) 50 MG tablet No longer needed (for PRN medications)    Follow-up: No follow-ups on file.   Caitlyn Shamseresa L Liam Bossman, MD

## 2018-08-14 NOTE — Patient Instructions (Addendum)
For your congestion and Post nasal drip:  Continue claritin for now  Add atrovent nasal spray 2 squirts every 12 hours  for post nasal drip  If tolerating atrovent,  But still sneezing/congested,  You can  try substutiting Singular f (or adding it to )  claritin     The ShingRx vaccine is now available in local pharmacies and is much more protective thant Zostavaxs,  It is therefore ADVISED for all interested adults over 61 to prevent shingles   Your annual mammogram has been ordered.  You are encouraged to call to make your appointment at [facility]   You might want to try a premixed protein drink called Premier Protein shake for breakfast or late night snack . It is great tasting,   very low sugar and has 50% of your calcium needs.  available in 4 packs  at [facility] and  In bulk at [employer] and [employer]  .    Nutritional analysis :  160 cal  30 g protein  1 g sugar 50% calcium needs   [employer] and [employer]   Health Maintenance for Postmenopausal Women Menopause is a normal process in which your reproductive ability comes to an end. This process happens gradually over a span of months to years, usually between the ages of 61 and 45. Menopause is complete when you have missed 12 consecutive menstrual periods. It is important to talk with your health care provider about some of the most common conditions that affect postmenopausal women, such as heart disease, cancer, and bone loss (osteoporosis). Adopting a healthy lifestyle and getting preventive care can help to promote your health and wellness. Those actions can also lower your chances of developing some of these common conditions. What should I know about menopause? During menopause, you may experience a number of symptoms, such as:  Moderate-to-severe hot flashes.  Night sweats.  Decrease in sex drive.  Mood swings.  Headaches.  Tiredness.  Irritability.  Memory problems.  Insomnia.  Choosing to treat or not to  treat menopausal changes is an individual decision that you make with your health care provider. What should I know about hormone replacement therapy and supplements? Hormone therapy products are effective for treating symptoms that are associated with menopause, such as hot flashes and night sweats. Hormone replacement carries certain risks, especially as you become older. If you are thinking about using estrogen or estrogen with progestin treatments, discuss the benefits and risks with your health care provider. What should I know about heart disease and stroke? Heart disease, heart attack, and stroke become more likely as you age. This may be due, in part, to the hormonal changes that your body experiences during menopause. These can affect how your body processes dietary fats, triglycerides, and cholesterol. Heart attack and stroke are both medical emergencies. There are many things that you can do to help prevent heart disease and stroke:  Have your blood pressure checked at least every 1-2 years. High blood pressure causes heart disease and increases the risk of stroke.  If you are 61 years old, ask your health care provider if you should take aspirin to prevent a heart attack or a stroke.  Do not use any tobacco products, including cigarettes, chewing tobacco, or electronic cigarettes. If you need help quitting, ask your health care provider.  It is important to eat a healthy diet and maintain a healthy weight. ? Be sure to include plenty of vegetables, fruits, low-fat dairy products,  and lean protein. ? Avoid eating foods that are high in solid fats, added sugars, or salt (sodium).  Get regular exercise. This is one of the most important things that you can do for your health. ? Try to exercise for at least 150 minutes each week. The type of exercise that you do should increase your heart rate and make you sweat. This is known as moderate-intensity exercise. ? Try to do strengthening  exercises at least twice each week. Do these in addition to the moderate-intensity exercise.  Know your numbers.Ask your health care provider to check your cholesterol and your blood glucose. Continue to have your blood tested as directed by your health care provider.  What should I know about cancer screening? There are several types of cancer. Take the following steps to reduce your risk and to catch any cancer development as early as possible. Breast Cancer  Practice breast self-awareness. ? This means understanding how your breasts normally appear and feel. ? It also means doing regular breast self-exams. Let your health care provider know about any changes, no matter how small.  If you are 61 or older, have a clinician do a breast exam (clinical breast exam or CBE) every year. Depending on your age, family history, and medical history, it may be recommended that you also have a yearly breast X-ray (mammogram).  If you have a family history of breast cancer, talk with your health care provider about genetic screening.  If you are at high risk for breast cancer, talk with your health care provider about having an MRI and a mammogram every year.  Breast cancer (BRCA) gene test is recommended for women who have family members with BRCA-related cancers. Results of the assessment will determine the need for genetic counseling and BRCA1 and for BRCA2 testing. BRCA-related cancers include these types: ? Breast. This occurs in males or females. ? Ovarian. ? Tubal. This may also be called fallopian tube cancer. ? Cancer of the abdominal or pelvic lining (peritoneal cancer). ? Prostate. ? Pancreatic.  Cervical, Uterine, and Ovarian Cancer Your health care provider may recommend that you be screened regularly for cancer of the pelvic organs. These include your ovaries, uterus, and vagina. This screening involves a pelvic exam, which includes checking for microscopic changes to the surface of  your cervix (Pap test).  For women ages 21-65, health care providers may recommend a pelvic exam and a Pap test every three years. For women ages 33-65, they may recommend the Pap test and pelvic exam, combined with testing for human papilloma virus (HPV), every five years. Some types of HPV increase your risk of cervical cancer. Testing for HPV may also be done on women of any age who have unclear Pap test results.  Other health care providers may not recommend any screening for nonpregnant women who are considered low risk for pelvic cancer and have no symptoms. Ask your health care provider if a screening pelvic exam is right for you.  If you have had past treatment for cervical cancer or a condition that could lead to cancer, you need Pap tests and screening for cancer for at least 20 years after your treatment. If Pap tests have been discontinued for you, your risk factors (such as having a new sexual partner) need to be reassessed to determine if you should start having screenings again. Some women have medical problems that increase the chance of getting cervical cancer. In these cases, your health care provider may recommend that you  have screening and Pap tests more often.  If you have a family history of uterine cancer or ovarian cancer, talk with your health care provider about genetic screening.  If you have vaginal bleeding after reaching menopause, tell your health care provider.  There are currently no reliable tests available to screen for ovarian cancer.  Lung Cancer Lung cancer screening is recommended for adults 42-48 years old who are at high risk for lung cancer because of a history of smoking. A yearly low-dose CT scan of the lungs is recommended if you:  Currently smoke.  Have a history of at least 30 pack-years of smoking and you currently smoke or have quit within the past 15 years. A pack-year is smoking an average of one pack of cigarettes per day for one year.  Yearly  screening should:  Continue until it has been 15 years since you quit.  Stop if you develop a health problem that would prevent you from having lung cancer treatment.  Colorectal Cancer  This type of cancer can be detected and can often be prevented.  Routine colorectal cancer screening usually begins at age 60 and continues through age 68.  If you have risk factors for colon cancer, your health care provider may recommend that you be screened at an earlier age.  If you have a family history of colorectal cancer, talk with your health care provider about genetic screening.  Your health care provider may also recommend using home test kits to check for hidden blood in your stool.  A small camera at the end of a tube can be used to examine your colon directly (sigmoidoscopy or colonoscopy). This is done to check for the earliest forms of colorectal cancer.  Direct examination of the colon should be repeated every 5-10 years until age 87. However, if early forms of precancerous polyps or small growths are found or if you have a family history or genetic risk for colorectal cancer, you may need to be screened more often.  Skin Cancer  Check your skin from head to toe regularly.  Monitor any moles. Be sure to tell your health care provider: ? About any new moles or changes in moles, especially if there is a change in a mole's shape or color. ? If you have a mole that is larger than the size of a pencil eraser.  If any of your family members has a history of skin cancer, especially at a young age, talk with your health care provider about genetic screening.  Always use sunscreen. Apply sunscreen liberally and repeatedly throughout the day.  Whenever you are outside, protect yourself by wearing long sleeves, pants, a wide-brimmed hat, and sunglasses.  What should I know about osteoporosis? Osteoporosis is a condition in which bone destruction happens more quickly than new bone creation.  After menopause, you may be at an increased risk for osteoporosis. To help prevent osteoporosis or the bone fractures that can happen because of osteoporosis, the following is recommended:  If you are 13-77 years old, get at least 1,000 mg of calcium and at least 600 mg of vitamin D per day.  If you are older than age 68 but younger than age 38, get at least 1,200 mg of calcium and at least 600 mg of vitamin D per day.  If you are older than age 30, get at least 1,200 mg of calcium and at least 800 mg of vitamin D per day.  Smoking and excessive alcohol intake increase the  risk of osteoporosis. Eat foods that are rich in calcium and vitamin D, and do weight-bearing exercises several times each week as directed by your health care provider. What should I know about how menopause affects my mental health? Depression may occur at any age, but it is more common as you become older. Common symptoms of depression include:  Low or sad mood.  Changes in sleep patterns.  Changes in appetite or eating patterns.  Feeling an overall lack of motivation or enjoyment of activities that you previously enjoyed.  Frequent crying spells.  Talk with your health care provider if you think that you are experiencing depression. What should I know about immunizations? It is important that you get and maintain your immunizations. These include:  Tetanus, diphtheria, and pertussis (Tdap) booster vaccine.  Influenza every year before the flu season begins.  Pneumonia vaccine.  Shingles vaccine.  Your health care provider may also recommend other immunizations. This information is not intended to replace advice given to you by your health care provider. Make sure you discuss any questions you have with your health care provider. Document Released: 10/08/2005 Document Revised: 03/05/2016 Document Reviewed: 05/20/2015 Elsevier Interactive Patient Education  2018 [employer].

## 2018-08-15 NOTE — Assessment & Plan Note (Signed)
Therapy  using  OCT meds  (anticholinergics  Steroids,  antihistamines)  outlined

## 2018-08-15 NOTE — Assessment & Plan Note (Addendum)
Annual comprehensive preventive exam was done as well as an evaluation and management of chronic conditions .  During the course of the visit the patient was educated and counseled about appropriate screening and preventive services including :  diabetes screening, lipid analysis with projected  10 year  risk for CAD  Which is 4.7 % using the Framingham risk calculator for women, , nutrition counseling, colorectal cancer screening, and recommended immunizations.  Printed recommendations for health maintenance screenings was given.   Lab Results  Component Value Date   CHOL 167 08/11/2017   HDL 79.30 08/11/2017   LDLCALC 79 08/11/2017   TRIG 44.0 08/11/2017   CHOLHDL 2 08/11/2017

## 2018-08-15 NOTE — Assessment & Plan Note (Signed)
Resolved since stopping Xyzal

## 2018-09-18 ENCOUNTER — Encounter: Payer: Self-pay | Admitting: Internal Medicine

## 2018-10-23 ENCOUNTER — Ambulatory Visit
Admission: RE | Admit: 2018-10-23 | Discharge: 2018-10-23 | Disposition: A | Discharge status: Home / Self Care | Payer: 59 | Source: Ambulatory Visit | Attending: Internal Medicine | Admitting: Internal Medicine

## 2018-10-23 DIAGNOSIS — Z1239 Encounter for other screening for malignant neoplasm of breast: Secondary | ICD-10-CM

## 2018-10-23 DIAGNOSIS — Z1231 Encounter for screening mammogram for malignant neoplasm of breast: Secondary | ICD-10-CM | POA: Diagnosis not present

## 2018-12-11 ENCOUNTER — Other Ambulatory Visit: Payer: Self-pay | Admitting: Internal Medicine

## 2019-01-10 ENCOUNTER — Ambulatory Visit (INDEPENDENT_AMBULATORY_CARE_PROVIDER_SITE_OTHER): Payer: 59 | Admitting: Family Medicine

## 2019-01-10 ENCOUNTER — Other Ambulatory Visit: Payer: Self-pay

## 2019-01-10 DIAGNOSIS — J309 Allergic rhinitis, unspecified: Secondary | ICD-10-CM | POA: Diagnosis not present

## 2019-01-10 DIAGNOSIS — R062 Wheezing: Secondary | ICD-10-CM | POA: Diagnosis not present

## 2019-01-10 MED ORDER — FLUTICASONE PROPIONATE HFA 44 MCG/ACT IN AERO: INHALATION_SPRAY | RESPIRATORY_TRACT | 1 refills | Status: DC

## 2019-01-10 NOTE — Progress Notes (Signed)
Patient ID: Caitlyn Gregory, female   DOB: 08/25/1956, 63 y.o.   MRN: [id number]    Virtual Visit via video Note  This visit type was conducted due to national recommendations for restrictions regarding the COVID-19 pandemic (e.g. social distancing).  This format is felt to be most appropriate for this patient at this time.  All issues noted in this document were discussed and addressed.  No physical exam was performed (except for noted visual exam findings with Video Visits).   I connected with Caitlyn Gregory today at 10:20 AM EDT by a video enabled telemedicine application and verified that I am speaking with the correct person using two identifiers. Location patient: home Location provider: [facility] Persons participating in the virtual visit: patient, provider  I discussed the limitations, risks, security and privacy concerns of performing an evaluation and management service by video and the availability of in person appointments. I also discussed with the patient that there may be a patient responsible charge related to this service. The patient expressed understanding and agreed to proceed.   HPI:  Patient and I connected via video due to complaints of nasal and chest congestion, some wheezing that seems of gotten worse over the past couple of months.  Patient has a long history of seasonal allergies that cause congestion and wheezing symptoms.  Patient has been on multiple allergy medications throughout the years and even at one point was on allergy injections.  Currently she takes Claritin in the morning Singulair in the evening and has resumed use of her Astelin nasal spray.  States for the most part these medications are helpful, but does notice sometimes where she has some more wheezing and feels little congested especially when the temperature dips at night and the weather is rainy.  Denies chest pain or palpitations.  Denies fever or chills.  Denies body aches.  Denies exposure to anyone  under investigation for COVID-19 or confirmed with COVID-19.  She wears a mask when she goes to the grocery store, wipe down the grocery cart with the sanitizing wipes and is diligent with her handwashing.  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   ROS: See pertinent positives and negatives per HPI.  Past Medical History:  Diagnosis Date  . Allergy   . Anemia   . Asthma   . Fibrocystic breast disease   . History of migraine headaches   . Hyperlipidemia   . Thyroid disease   . Vitamin D deficiency     Past Surgical History:  Procedure Laterality Date  . APPENDECTOMY  1967   secondary to rupture  . BREAST CYST ASPIRATION Left 2009   neg  . chin implant  1980s  . FRONTALIS SUSPENSION  2008  . HERNIA REPAIR  2008    Family History  Problem Relation Age of Onset  . Stroke Mother [id number]  . Cancer Mother        unknown tyoe  . Osteoporosis Mother   . Breast cancer Mother [id number]  . Heart disease Father 2344       AMI, died at 7762 from second MI  . Hypertension Father   . Alcohol abuse Brother   . Hypertension Brother    Social History   Tobacco Use  . Smoking status: Never Smoker  . Smokeless tobacco: Never Used  Substance Use Topics  . Alcohol use: No    Current Outpatient Medications:  .  Aspirin-Acetaminophen-Caffeine (EXCEDRIN PO), Take by mouth daily as  needed , Disp: , Rfl:  .  Calcipotriene-Betameth Diprop (ENSTILAR) 0.005-0.064 % FOAM, Apply 1 application topically daily., Disp: , Rfl:  .  Cholecalciferol (VITAMIN D) 2000 UNITS CAPS, Take one by mouth daily , Disp: , Rfl:  .  ipratropium (ATROVENT) 0.03 % nasal spray, Place 2 sprays into both nostrils every 12 (twelve) hours., Disp: 30 mL, Rfl: 12 .  loratadine (CLARITIN) 10 MG tablet, Take 10 mg by mouth daily., Disp: , Rfl:  .  montelukast (SINGULAIR) 10 MG tablet, Take 1 tablet (10 mg total) by mouth at bedtime., Disp: 30 tablet, Rfl: 3 .  propranolol (INDERAL) 10 MG tablet, Take 1 tablet (10  mg total) by mouth 3 (three) times daily. As needed for palpitations, Disp: 30 tablet, Rfl: 2 .  SUMAtriptan (IMITREX) 100 MG tablet, TAKE 1/2 TABLET AT ONSET OF MIGRAINE THEN 1 TABLET 2 HOURS LATER IF NEEDED. Max dose 200 mg in 1 day, Disp: 9 tablet, Rfl: 11 .  SYNTHROID 75 MCG tablet, TAKE 1 TABLET BY MOUTH DAILY, Disp: 90 tablet, Rfl: 1  EXAM:  GENERAL: alert, oriented, appears well and in no acute distress  HEENT: atraumatic, conjunttiva clear, no obvious abnormalities on inspection of external nose and ears  NECK: normal movements of the head and neck  LUNGS: on inspection no signs of respiratory distress, breathing rate appears normal, no obvious gross SOB, gasping or wheezing  CV: no obvious cyanosis  MS: moves all visible extremities without noticeable abnormality  PSYCH/NEURO: pleasant and cooperative, no obvious depression or anxiety, speech and thought processing grossly intact  ASSESSMENT AND PLAN:  Discussed the following assessment and plan:  Allergic rhinitis, unspecified seasonality, unspecified trigger - Plan: fluticasone (FLOVENT HFA) 44 MCG/ACT inhaler  Wheezing - Plan: fluticasone (FLOVENT HFA) 44 MCG/ACT inhaler  Suspect patient's symptoms of wheezing and chest congestion are exacerbations of her chronic allergic rhinitis.  She will continue Claritin and Singulair as she has been doing and also on the Astelin nasal spray.  We will add back on Flovent inhaler, she has taken this medication previously with great success.  Once allergy season is over, we can have patient stop the Flovent inhaler and monitor her symptoms to see if resuming the inhaler as necessary.  Advised to monitor cell for any changes including shortness of breath medicine worsening, development of fever or chills, chest pain and if any other new/different symptoms occur she has been advised to call office right away.   I discussed the assessment and treatment plan with the patient. The patient was  provided an opportunity to ask questions and all were answered. The patient agreed with the plan and demonstrated an understanding of the instructions.   The patient was advised to call back or seek an in-person evaluation if the symptoms worsen or if the condition fails to improve as anticipated.   Tracey Harries, FNP

## 2019-01-10 NOTE — Telephone Encounter (Signed)
Reviewed message.  Would recommend visit to discuss.  Please confirm with pt no acute issues.

## 2019-01-10 NOTE — Telephone Encounter (Signed)
Left message on Mobile number for patient to call office to schedule virtual visit.

## 2019-01-18 ENCOUNTER — Other Ambulatory Visit: Payer: Self-pay

## 2019-01-18 ENCOUNTER — Encounter: Payer: Self-pay | Admitting: Family Medicine

## 2019-01-18 ENCOUNTER — Ambulatory Visit (INDEPENDENT_AMBULATORY_CARE_PROVIDER_SITE_OTHER): Payer: 59 | Admitting: Family Medicine

## 2019-01-18 DIAGNOSIS — J040 Acute laryngitis: Secondary | ICD-10-CM | POA: Diagnosis not present

## 2019-01-18 DIAGNOSIS — J309 Allergic rhinitis, unspecified: Secondary | ICD-10-CM | POA: Diagnosis not present

## 2019-01-18 MED ORDER — AZITHROMYCIN 250 MG PO TABS: ORAL_TABLET | ORAL | 0 refills | Status: DC

## 2019-01-18 MED ORDER — METHYLPREDNISOLONE 4 MG PO TBPK: ORAL_TABLET | ORAL | 0 refills | Status: DC

## 2019-01-18 NOTE — Progress Notes (Signed)
Patient ID: Caitlyn Gregory, female   DOB: 01/03/1956, 63 y.o.   MRN: [id number]    Virtual Visit via video Note  This visit type was conducted due to national recommendations for restrictions regarding the COVID-19 pandemic (e.g. social distancing).  This format is felt to be most appropriate for this patient at this time.  All issues noted in this document were discussed and addressed.  No physical exam was performed (except for noted visual exam findings with Video Visits).   I connected with Caitlyn Gregory today at  1:20 PM EDT by a video enabled telemedicine application and verified that I am speaking with the correct person using two identifiers. Location patient: home Location provider: [facility] Persons participating in the virtual visit: patient, provider  I discussed the limitations, risks, security and privacy concerns of performing an evaluation and management service by video  and the availability of in person appointments. I also discussed with the patient that there may be a patient responsible charge related to this service. The patient expressed understanding and agreed to proceed.  HPI:  Patient and I connected via video due to complaint of scratchy voice and worsening allergy symptoms.  Patient has dealt with seasonal allergies for years, this is been a tough spring for her.  She takes Claritin or Zyrtec every morning and Singulair at night.  Uses Afrin nasal spray and also Flovent inhaler to keep her allergy symptoms under control.  It has been very rainy the past few days, usually when it is rainy the mold and mildew kick up and patient symptoms are worse.  Denies fever or chills.  Denies severe pain in throat.  Denies cough, shortness of breath or wheezing.  Denies chest pain.  Denies body aches.  Denies GI or GU issues.  ROS: See pertinent positives and negatives per HPI.  Past Medical History:  Diagnosis Date  . Allergy   . Anemia   . Asthma   . Fibrocystic breast disease    . History of migraine headaches   . Hyperlipidemia   . Thyroid disease   . Vitamin D deficiency     Past Surgical History:  Procedure Laterality Date  . APPENDECTOMY  1967   secondary to rupture  . BREAST CYST ASPIRATION Left 2009   neg  . chin implant  1980s  . FRONTALIS SUSPENSION  2008  . HERNIA REPAIR  2008    Family History  Problem Relation Age of Onset  . Stroke Mother 9788  . Cancer Mother        unknown tyoe  . Osteoporosis Mother   . Breast cancer Mother 5080  . Heart disease Father 5944       AMI, died at 3462 from second MI  . Hypertension Father   . Alcohol abuse Brother   . Hypertension Brother    Social History   Tobacco Use  . Smoking status: Never Smoker  . Smokeless tobacco: Never Used  Substance Use Topics  . Alcohol use: No    Current Outpatient Medications:  .  Aspirin-Acetaminophen-Caffeine (EXCEDRIN PO), Take by mouth daily as needed , Disp: , Rfl:  .  Calcipotriene-Betameth Diprop (ENSTILAR) 0.005-0.064 % FOAM, Apply 1 application topically daily., Disp: , Rfl:  .  Cholecalciferol (VITAMIN D) 2000 UNITS CAPS, Take one by mouth daily , Disp: , Rfl:  .  fluticasone (FLOVENT HFA) 44 MCG/ACT inhaler, INHALE 2 PUFFS BY MOUTH AS DIRECTED 1-2 TIMES A DAY, Disp: 10.6 g, Rfl: 1 .  ipratropium (ATROVENT) 0.03 % nasal spray, Place 2 sprays into both nostrils every 12 (twelve) hours., Disp: 30 mL, Rfl: 12 .  loratadine (CLARITIN) 10 MG tablet, Take 10 mg by mouth daily., Disp: , Rfl:  .  montelukast (SINGULAIR) 10 MG tablet, Take 1 tablet (10 mg total) by mouth at bedtime., Disp: 30 tablet, Rfl: 3 .  propranolol (INDERAL) 10 MG tablet, Take 1 tablet (10 mg total) by mouth 3 (three) times daily. As needed for palpitations, Disp: 30 tablet, Rfl: 2 .  SUMAtriptan (IMITREX) 100 MG tablet, TAKE 1/2 TABLET AT ONSET OF MIGRAINE THEN 1 TABLET 2 HOURS LATER IF NEEDED. Max dose 200 mg in 1 day, Disp: 9 tablet, Rfl: 11 .  SYNTHROID 75 MCG tablet, TAKE 1 TABLET BY MOUTH  DAILY, Disp: 90 tablet, Rfl: 1  EXAM:  GENERAL: alert, oriented, appears well and in no acute distress  HEENT: atraumatic, conjunttiva clear, no obvious abnormalities on inspection of external nose and ears  NECK: normal movements of the head and neck  LUNGS: on inspection no signs of respiratory distress, breathing rate appears normal, no obvious gross SOB, gasping or wheezing  CV: no obvious cyanosis  MS: moves all visible extremities without noticeable abnormality  PSYCH/NEURO: pleasant and cooperative, no obvious depression or anxiety, speech and thought processing grossly intact  ASSESSMENT AND PLAN:  Discussed the following assessment and plan:  Laryngitis - Plan: methylPREDNISolone (MEDROL DOSEPAK) 4 MG TBPK tablet, azithromycin (ZITHROMAX) 250 MG tablet  Allergic rhinitis, unspecified seasonality, unspecified trigger - Plan: methylPREDNISolone (MEDROL DOSEPAK) 4 MG TBPK tablet, azithromycin (ZITHROMAX) 250 MG tablet   Patient symptoms are consistent with a laryngitis.  Patient will take Medrol taper to reduce laryngitis symptoms.  She will continue all her current allergy medications.  Patient given Z-Pak in case symptoms do not improve over the next couple of days with steroid use.  Advised to rest voice is much as possible, gargle with salt water and or drink warm beverages to help soothe throat.    I discussed the assessment and treatment plan with the patient. The patient was provided an opportunity to ask questions and all were answered. The patient agreed with the plan and demonstrated an understanding of the instructions.   The patient was advised to call back or seek an in-person evaluation if the symptoms worsen or if the condition fails to improve as anticipated.   Tracey Harries, FNP

## 2019-01-24 NOTE — Telephone Encounter (Signed)
Scheduled with [facility].

## 2019-03-17 ENCOUNTER — Other Ambulatory Visit: Payer: Self-pay | Admitting: Internal Medicine

## 2019-03-17 DIAGNOSIS — G43809 Other migraine, not intractable, without status migrainosus: Secondary | ICD-10-CM

## 2019-04-10 ENCOUNTER — Other Ambulatory Visit: Payer: Self-pay | Admitting: Internal Medicine

## 2019-05-14 ENCOUNTER — Other Ambulatory Visit: Payer: Self-pay | Admitting: Internal Medicine

## 2019-05-14 DIAGNOSIS — G43809 Other migraine, not intractable, without status migrainosus: Secondary | ICD-10-CM

## 2019-05-26 ENCOUNTER — Other Ambulatory Visit: Payer: Self-pay | Admitting: Internal Medicine

## 2019-07-27 ENCOUNTER — Other Ambulatory Visit: Payer: Self-pay | Admitting: Internal Medicine

## 2019-07-27 DIAGNOSIS — G43809 Other migraine, not intractable, without status migrainosus: Secondary | ICD-10-CM

## 2019-08-15 ENCOUNTER — Other Ambulatory Visit: Payer: Self-pay

## 2019-08-21 ENCOUNTER — Encounter: Payer: 59 | Admitting: Internal Medicine

## 2019-09-12 ENCOUNTER — Other Ambulatory Visit: Payer: Self-pay | Admitting: Internal Medicine

## 2019-09-12 DIAGNOSIS — G43809 Other migraine, not intractable, without status migrainosus: Secondary | ICD-10-CM

## 2019-09-20 ENCOUNTER — Other Ambulatory Visit: Payer: Self-pay

## 2019-09-24 ENCOUNTER — Other Ambulatory Visit: Payer: Self-pay

## 2019-09-24 ENCOUNTER — Encounter: Payer: Self-pay | Admitting: Internal Medicine

## 2019-09-24 ENCOUNTER — Ambulatory Visit (INDEPENDENT_AMBULATORY_CARE_PROVIDER_SITE_OTHER): Payer: 59 | Admitting: Internal Medicine

## 2019-09-24 VITALS — BP 116/72 | HR 88 | Temp 97.0°F | Resp 14 | Ht 63.0 in | Wt 123.8 lb

## 2019-09-24 DIAGNOSIS — E559 Vitamin D deficiency, unspecified: Secondary | ICD-10-CM

## 2019-09-24 DIAGNOSIS — Z0000 Encounter for general adult medical examination without abnormal findings: Secondary | ICD-10-CM

## 2019-09-24 DIAGNOSIS — E034 Atrophy of thyroid (acquired): Secondary | ICD-10-CM

## 2019-09-24 DIAGNOSIS — Z1231 Encounter for screening mammogram for malignant neoplasm of breast: Secondary | ICD-10-CM | POA: Diagnosis not present

## 2019-09-24 DIAGNOSIS — R05 Cough: Secondary | ICD-10-CM

## 2019-09-24 DIAGNOSIS — R059 Cough, unspecified: Secondary | ICD-10-CM

## 2019-09-24 DIAGNOSIS — J309 Allergic rhinitis, unspecified: Secondary | ICD-10-CM

## 2019-09-24 DIAGNOSIS — R5383 Other fatigue: Secondary | ICD-10-CM | POA: Diagnosis not present

## 2019-09-24 DIAGNOSIS — Z1211 Encounter for screening for malignant neoplasm of colon: Secondary | ICD-10-CM

## 2019-09-24 LAB — COMPREHENSIVE METABOLIC PANEL
ALT: 13 U/L (ref 0–35)
AST: 17 U/L (ref 0–37)
Albumin: 4.1 g/dL (ref 3.5–5.2)
Alkaline Phosphatase: 55 U/L (ref 39–117)
BUN: 16 mg/dL (ref 6–23)
CO2: 31 mEq/L (ref 19–32)
Calcium: 9.4 mg/dL (ref 8.4–10.5)
Chloride: 101 mEq/L (ref 96–112)
Creatinine, Ser: 0.81 mg/dL (ref 0.40–1.20)
GFR: 71.44 mL/min (ref >60.00)
Glucose, Bld: 89 mg/dL (ref 70–99)
Potassium: 4.2 mEq/L (ref 3.5–5.1)
Sodium: 136 mEq/L (ref 135–145)
Total Bilirubin: 0.3 mg/dL (ref 0.2–1.2)
Total Protein: 6.7 g/dL (ref 6.0–8.3)

## 2019-09-24 LAB — CBC WITH DIFFERENTIAL/PLATELET
Basophils Absolute: 0 10*3/uL (ref 0.0–0.1)
Basophils Relative: 1 % (ref 0.0–3.0)
Eosinophils Absolute: 0.1 10*3/uL (ref 0.0–0.7)
Eosinophils Relative: 2.4 % (ref 0.0–5.0)
HCT: 39.9 % (ref 36.0–46.0)
Hemoglobin: 13.6 g/dL (ref 12.0–15.0)
Lymphocytes Relative: 22.5 % (ref 12.0–46.0)
Lymphs Abs: 1 10*3/uL (ref 0.7–4.0)
MCHC: 34.2 g/dL (ref 30.0–36.0)
MCV: 100.2 fl — ABNORMAL HIGH (ref 78.0–100.0)
Monocytes Absolute: 0.6 10*3/uL (ref 0.1–1.0)
Monocytes Relative: 12.8 % — ABNORMAL HIGH (ref 3.0–12.0)
Neutro Abs: 2.7 10*3/uL (ref 1.4–7.7)
Neutrophils Relative %: 61.3 % (ref 43.0–77.0)
Platelets: 212 10*3/uL (ref 150.0–400.0)
RBC: 3.98 Mil/uL (ref 3.87–5.11)
RDW: 12.1 % (ref 11.5–15.5)
WBC: 4.3 10*3/uL (ref 4.0–10.5)

## 2019-09-24 LAB — VITAMIN D 25 HYDROXY (VIT D DEFICIENCY, FRACTURES): VITD: 50.36 ng/mL (ref 30.00–100.00)

## 2019-09-24 LAB — TSH: TSH: 1.79 u[IU]/mL (ref 0.35–4.50)

## 2019-09-24 MED ORDER — MONTELUKAST SODIUM 10 MG PO TABS: 10.0000 mg | ORAL_TABLET | Freq: Every day | ORAL | 5 refills | Status: DC

## 2019-09-24 NOTE — Progress Notes (Signed)
Patient ID: DRUCELLA KARBOWSKI, female    DOB: [birth year]  Age: 64 y.o. MRN: [id number]  The patient is here for annual Medicare wellness examination and management of other chronic and acute problems.   This visit occurred during the SARS-CoV-2 public health emergency.  Safety protocols were in place, including screening questions prior to the visit, additional usage of staff PPE, and extensive cleaning of exam room while observing appropriate contact time as indicated for disinfecting solutions.   The risk factors are reflected in the social history.  The roster of all physicians providing medical care to patient - is listed in the Snapshot section of the chart.  Activities of daily living:  The patient is 100% independent in all ADLs: dressing, toileting, feeding as well as independent mobility  Home safety : The patient has smoke detectors in the home. They wear seatbelts.  There are no firearms at home. There is no violence in the home.   There is no risks for hepatitis, STDs or HIV. There is no   history of blood transfusion. They have no travel history to infectious disease endemic areas of the world.  The patient has seen their dentist in the last six month. They have seen their eye doctor in the last year. .  They do not  have excessive sun exposure. Discussed the need for sun protection: hats, long sleeves and use of sunscreen if there is significant sun exposure.   Diet: the importance of a healthy diet is discussed. They do have a healthy diet.  The benefits of regular aerobic exercise were discussed. She walks and does yoga  4 times per week , 60 minutes  Depression screen: there are no signs or vegative symptoms of depression- irritability, change in appetite, anhedonia, sadness/tearfullness.  Cognitive assessment: the patient manages all their financial and personal affairs and is actively engaged. They could relate day,date,year and events; recalled 2/3 objects at 3 minutes; performed  clock-face test normally.  The following portions of the patient's history were reviewed and updated as appropriate: allergies, current medications, past family history, past medical history,  past surgical history, past social history  and problem list.  Visual acuity was not assessed per patient preference since she has regular follow up with her ophthalmologist. Hearing and body mass index were assessed and reviewed.   During the course of the visit the patient was educated and counseled about appropriate screening and preventive services including : fall prevention , diabetes screening, nutrition counseling, colorectal cancer screening, and recommended immunizations.    CC: The primary encounter diagnosis was Encounter for screening mammogram for malignant neoplasm of breast. Diagnoses of Hypothyroidism due to acquired atrophy of thyroid, Cough, Vitamin D deficiency, Fatigue, unspecified type, Colon cancer screening, Visit for preventive health examination, and Allergic rhinitis, unspecified seasonality, unspecified trigger were also pertinent to this visit.  History Devota has a past medical history of Allergy, Anemia, Asthma, Fibrocystic breast disease, History of migraine headaches, Hyperlipidemia, Thyroid disease, and Vitamin D deficiency.   She has a past surgical history that includes chin implant (1980s); Appendectomy (1967); Frontalis suspension (2008); Hernia repair (2008); and Breast cyst aspiration (Left, 2009).   Her family history includes Alcohol abuse in her brother; Breast cancer (age of onset: 46) in her mother; Cancer in her mother; Heart disease (age of onset: 8) in her father; Hypertension in her brother and father; Osteoporosis in her mother; Stroke (age of onset: 29) in her mother.She reports that she has never smoked. She has  never used smokeless tobacco. She reports that she does not drink alcohol or use drugs.  Outpatient Medications Prior to Visit  Medication Sig  Dispense Refill  . Aspirin-Acetaminophen-Caffeine (EXCEDRIN PO) Take by mouth daily as needed     . Calcipotriene-Betameth Diprop (ENSTILAR) 0.005-0.064 % FOAM Apply 1 application topically daily.    . Cholecalciferol (VITAMIN D) 2000 UNITS CAPS Take one by mouth daily     . clotrimazole-betamethasone (LOTRISONE) cream APPLY TO AFFECTED AREAS ONCE A DAY AS NEEDED    . loratadine (CLARITIN) 10 MG tablet Take 10 mg by mouth daily.    . SUMAtriptan (IMITREX) 100 MG tablet TAKE 1/2 TABLET AT ONSET OF MIGRAINE THEN TAKE 1 TABLET 2 HOURS LATER IF NEEDED MAX OF 2 TABLETS PER DAY 10 tablet 0  . SYNTHROID 75 MCG tablet TAKE 1 TABLET EVERY DAY ON EMPTY STOMACHWITH A GLASS OF WATER AT LEAST 30-60 MINBEFORE BREAKFAST 90 tablet 1  . azithromycin (ZITHROMAX) 250 MG tablet Take 2 tablets on day 1, take 1 tablet on days 2-5 (Patient not taking: Reported on 09/24/2019) 6 tablet 0  . fluticasone (FLOVENT HFA) 44 MCG/ACT inhaler INHALE 2 PUFFS BY MOUTH AS DIRECTED 1-2 TIMES A DAY (Patient not taking: Reported on 09/24/2019) 10.6 g 1  . ipratropium (ATROVENT) 0.03 % nasal spray Place 2 sprays into both nostrils every 12 (twelve) hours. (Patient not taking: Reported on 09/24/2019) 30 mL 12  . methylPREDNISolone (MEDROL DOSEPAK) 4 MG TBPK tablet Take according to pack instructions (Patient not taking: Reported on 09/24/2019) 21 tablet 0  . montelukast (SINGULAIR) 10 MG tablet TAKE ONE TABLET AT BEDTIME (Patient not taking: Reported on 09/24/2019) 30 tablet 3  . propranolol (INDERAL) 10 MG tablet Take 1 tablet (10 mg total) by mouth 3 (three) times daily. As needed for palpitations (Patient not taking: Reported on 09/24/2019) 30 tablet 2   No facility-administered medications prior to visit.    Review of Systems   Patient denies headache, fevers, malaise, unintentional weight loss, skin rash, eye pain, sinus congestion and sinus pain, sore throat, dysphagia,  hemoptysis , cough, dyspnea, wheezing, chest pain, palpitations,  orthopnea, edema, abdominal pain, nausea, melena, diarrhea, constipation, flank pain, dysuria, hematuria, urinary  Frequency, nocturia, numbness, tingling, seizures,  Focal weakness, Loss of consciousness,  Tremor, insomnia, depression, anxiety, and suicidal ideation.      Objective:  BP 116/72 (BP Location: Left Arm, Patient Position: Sitting, Cuff Size: Normal)   Pulse 88   Temp (!) 97 F (36.1 C) (Temporal)   Resp 14   Ht 5\' 3"  (1.6 m)   Wt 123 lb 12.8 oz (56.2 kg)   SpO2 96%   BMI 21.93 kg/m   Physical Exam   General appearance: alert, cooperative and appears stated age Head: Normocephalic, without obvious abnormality, atraumatic Eyes: conjunctivae/corneas clear. PERRL, EOM's intact. Fundi benign. Ears: normal TM's and external ear canals both ears Nose: Nares normal. Septum midline. Mucosa normal. No drainage or sinus tenderness. Throat: lips, mucosa, and tongue normal; teeth and gums normal Neck: no adenopathy, no carotid bruit, no JVD, supple, symmetrical, trachea midline and thyroid not enlarged, symmetric, no tenderness/mass/nodules Lungs: clear to auscultation bilaterally Breasts: normal appearance, no masses or tenderness Heart: regular rate and rhythm, S1, S2 normal, no murmur, click, rub or gallop Abdomen: soft, non-tender; bowel sounds normal; no masses,  no organomegaly Extremities: extremities normal, atraumatic, no cyanosis or edema Pulses: 2+ and symmetric Skin: Skin color, texture, turgor normal. No rashes or lesions Neurologic: Alert and  oriented X 3, normal strength and tone. Normal symmetric reflexes. Normal coordination and gait.    Assessment & Plan:   Problem List Items Addressed This Visit      Unprioritized   Fatigue   Relevant Orders   Comprehensive metabolic panel   Hypothyroidism   Relevant Orders   TSH   Rhinitis, allergic    Continue claritin daily.  Adding back every other day singulair.   If not controlled.  Daily singulair trial.  If  not toleralted,  Add azelastine nasal sprya,  avoidiing steroid nasals sprays due to perforated septum       Visit for preventive health examination    age appropriate education and counseling updated, referrals for preventative services and immunizations addressed, dietary and smoking counseling addressed, most recent labs reviewed.  I have personally reviewed and have noted:  1) the patient's medical and social history 2) The pt's use of alcohol, tobacco, and illicit drugs 3) The patient's current medications and supplements 4) Functional ability including ADL's, fall risk, home safety risk, hearing and visual impairment 5) Diet and physical activities 6) Evidence for depression or mood disorder 7) The patient's height, weight, and BMI have been recorded in the chart  I have made referrals, and provided counseling and education based on review of the above       Other Visit Diagnoses    Encounter for screening mammogram for malignant neoplasm of breast    -  Primary   Relevant Orders   MM 3D SCREEN BREAST BILATERAL   Cough       Relevant Orders   CBC with Differential/Platelet   Vitamin D deficiency       Relevant Orders   VITAMIN D 25 Hydroxy (Vit-D Deficiency, Fractures)   Colon cancer screening       Relevant Orders   Cologuard      I have discontinued Aishi H. Bell's propranolol, ipratropium, fluticasone, methylPREDNISolone, azithromycin, and montelukast. I am also having her start on montelukast. Additionally, I am having her maintain her Vitamin D, Aspirin-Acetaminophen-Caffeine (EXCEDRIN PO), Calcipotriene-Betameth Diprop, loratadine, Synthroid, SUMAtriptan, and clotrimazole-betamethasone.  Meds ordered this encounter  Medications  . montelukast (SINGULAIR) 10 MG tablet    Sig: Take 1 tablet (10 mg total) by mouth at bedtime.    Dispense:  30 tablet    Refill:  5    KEEP ON FILE FOR FUTURE REFILLS    Medications Discontinued During This Encounter  Medication  Reason  . azithromycin (ZITHROMAX) 250 MG tablet Completed Course  . fluticasone (FLOVENT HFA) 44 MCG/ACT inhaler Patient has not taken in last 30 days  . ipratropium (ATROVENT) 0.03 % nasal spray Completed Course  . methylPREDNISolone (MEDROL DOSEPAK) 4 MG TBPK tablet Completed Course  . montelukast (SINGULAIR) 10 MG tablet Completed Course  . propranolol (INDERAL) 10 MG tablet Patient has not taken in last 30 days    Follow-up: No follow-ups on file.   Crecencio Mc, MD

## 2019-09-24 NOTE — Patient Instructions (Signed)

## 2019-09-24 NOTE — Assessment & Plan Note (Signed)
Continue claritin daily.  Adding back every other day singulair.   If not controlled.  Daily singulair trial.  If not toleralted,  Add azelastine nasal sprya,  avoidiing steroid nasals sprays due to perforated septum

## 2019-09-24 NOTE — Assessment & Plan Note (Signed)

## 2019-09-29 ENCOUNTER — Other Ambulatory Visit: Payer: Self-pay | Admitting: Internal Medicine

## 2019-11-07 LAB — COLOGUARD: Cologuard: NEGATIVE

## 2019-11-11 LAB — COLOGUARD: COLOGUARD: NEGATIVE

## 2019-11-14 ENCOUNTER — Telehealth: Payer: Self-pay | Admitting: Internal Medicine

## 2019-11-14 LAB — COLOGUARD: Cologuard: NEGATIVE

## 2019-11-14 NOTE — Telephone Encounter (Signed)
The results of patient's cologuard is negative. Results abstracted and MyChart message sent  

## 2019-11-29 ENCOUNTER — Ambulatory Visit: Payer: 59 | Attending: Internal Medicine

## 2019-11-29 DIAGNOSIS — Z23 Encounter for immunization: Secondary | ICD-10-CM

## 2019-11-29 NOTE — Progress Notes (Signed)
   Covid-19 Vaccination Clinic  Name:  Caitlyn Gregory    MRN: [id number] DOB: [birth year]  11/29/2019  [name] was observed post Covid-19 immunization for 15 minutes without incident. She was provided with Vaccine Information Sheet and instruction to access the V-Safe system.   [name] was instructed to call 911 with any severe reactions post vaccine: Marland Kitchen Difficulty breathing  . Swelling of face and throat  . A fast heartbeat  . A bad rash all over body  . Dizziness and weakness   Immunizations Administered    Name Date Dose VIS Date Route   Pfizer COVID-19 Vaccine 11/29/2019 11:25 AM 0.3 mL 08/10/2019 Intramuscular   Manufacturer: [employer], [employer]   Lot: [phone] [id number]   NDC: [address]-6

## 2019-12-10 ENCOUNTER — Other Ambulatory Visit: Payer: Self-pay

## 2019-12-10 ENCOUNTER — Other Ambulatory Visit: Payer: Self-pay | Admitting: Internal Medicine

## 2019-12-10 DIAGNOSIS — G43809 Other migraine, not intractable, without status migrainosus: Secondary | ICD-10-CM

## 2019-12-11 MED ORDER — SUMATRIPTAN SUCCINATE 100 MG PO TABS: ORAL_TABLET | ORAL | 0 refills | Status: DC

## 2019-12-21 ENCOUNTER — Ambulatory Visit: Payer: 59 | Attending: Internal Medicine

## 2019-12-21 DIAGNOSIS — Z23 Encounter for immunization: Secondary | ICD-10-CM

## 2019-12-21 NOTE — Progress Notes (Signed)
   Covid-19 Vaccination Clinic  Name:  Caitlyn Gregory    MRN: [id number] DOB: 1956/01/19  12/21/2019  [name] was observed post Covid-19 immunization for 15 minutes without incident. She was provided with Vaccine Information Sheet and instruction to access the V-Safe system.   [name] was instructed to call 911 with any severe reactions post vaccine: Marland Kitchen Difficulty breathing  . Swelling of face and throat  . A fast heartbeat  . A bad rash all over body  . Dizziness and weakness   Immunizations Administered    Name Date Dose VIS Date Route   Pfizer COVID-19 Vaccine 12/21/2019 11:46 AM 0.3 mL 10/24/2018 Intramuscular   Manufacturer: [employer], [employer]   Lot: [id number]   NDC: [address]-9

## 2019-12-26 ENCOUNTER — Ambulatory Visit: Payer: 59

## 2020-01-22 ENCOUNTER — Other Ambulatory Visit: Payer: Self-pay | Admitting: Internal Medicine

## 2020-01-22 DIAGNOSIS — G43809 Other migraine, not intractable, without status migrainosus: Secondary | ICD-10-CM

## 2020-02-28 ENCOUNTER — Ambulatory Visit
Admission: RE | Admit: 2020-02-28 | Discharge: 2020-02-28 | Disposition: A | Discharge status: Home / Self Care | Payer: No Typology Code available for payment source | Source: Ambulatory Visit | Attending: Internal Medicine | Admitting: Internal Medicine

## 2020-02-28 DIAGNOSIS — Z1231 Encounter for screening mammogram for malignant neoplasm of breast: Secondary | ICD-10-CM | POA: Diagnosis present

## 2020-03-03 ENCOUNTER — Other Ambulatory Visit: Payer: Self-pay | Admitting: Internal Medicine

## 2020-03-03 DIAGNOSIS — R928 Other abnormal and inconclusive findings on diagnostic imaging of breast: Secondary | ICD-10-CM

## 2020-03-04 ENCOUNTER — Other Ambulatory Visit: Payer: Self-pay | Admitting: Internal Medicine

## 2020-03-04 DIAGNOSIS — R928 Other abnormal and inconclusive findings on diagnostic imaging of breast: Secondary | ICD-10-CM

## 2020-03-05 ENCOUNTER — Other Ambulatory Visit: Payer: Self-pay | Admitting: Internal Medicine

## 2020-03-05 DIAGNOSIS — R928 Other abnormal and inconclusive findings on diagnostic imaging of breast: Secondary | ICD-10-CM

## 2020-03-05 DIAGNOSIS — N6489 Other specified disorders of breast: Secondary | ICD-10-CM

## 2020-03-06 ENCOUNTER — Other Ambulatory Visit: Payer: Self-pay | Admitting: Internal Medicine

## 2020-03-06 ENCOUNTER — Telehealth: Payer: Self-pay

## 2020-03-06 DIAGNOSIS — G43809 Other migraine, not intractable, without status migrainosus: Secondary | ICD-10-CM

## 2020-03-06 NOTE — Telephone Encounter (Signed)
I have contacted patient and addressed concerns of getting my chart message on Friday.  She is scheduled for her additional mammograms imaging on Tuesday 03/11/2020 2:40pm.  I have provided her with Patient experience phone number to express her concerns of getting her results via My chart on Friday.

## 2020-03-11 ENCOUNTER — Ambulatory Visit
Admission: RE | Admit: 2020-03-11 | Discharge: 2020-03-11 | Disposition: A | Discharge status: Home / Self Care | Payer: No Typology Code available for payment source | Source: Ambulatory Visit | Attending: Internal Medicine | Admitting: Internal Medicine

## 2020-03-11 DIAGNOSIS — N6489 Other specified disorders of breast: Secondary | ICD-10-CM | POA: Diagnosis present

## 2020-03-11 DIAGNOSIS — R928 Other abnormal and inconclusive findings on diagnostic imaging of breast: Secondary | ICD-10-CM

## 2020-03-29 ENCOUNTER — Other Ambulatory Visit: Payer: Self-pay | Admitting: Internal Medicine

## 2020-05-21 ENCOUNTER — Telehealth: Payer: Self-pay | Admitting: Internal Medicine

## 2020-05-21 NOTE — Telephone Encounter (Signed)
Pt was exposed to covid over the weekend and took an at home test and it came back positive  Pt wanted to know what supplements she needed to take also she just said that she is having allergy symptoms  Pt made an VV for 9/23

## 2020-05-21 NOTE — Telephone Encounter (Signed)
I called patient & she does have virtual with Dr. Darrick Huntsman tomorrow. She had positive in home test & I suggested that she have a PCR test. I gave her info for health dept. Testing site for today, which is [employer] [facility] [employer]. Pt's symptoms are mild allergy symptoms with post nasal drip & some congestion. She also deals with this all year long, so wasn't;t overly concerned. She knew though that she had an exposure on Saturday. I told her according to chart she did not qualify for MAI, but we would let Dr. Darrick Huntsman determine. She was given list of vitamins to take per providers suggestions.

## 2020-05-22 ENCOUNTER — Encounter: Payer: Self-pay | Admitting: Internal Medicine

## 2020-05-22 ENCOUNTER — Telehealth: Payer: No Typology Code available for payment source | Admitting: Internal Medicine

## 2020-05-22 DIAGNOSIS — F411 Generalized anxiety disorder: Secondary | ICD-10-CM | POA: Diagnosis not present

## 2020-05-22 DIAGNOSIS — F430 Acute stress reaction: Secondary | ICD-10-CM

## 2020-05-22 DIAGNOSIS — Z20822 Contact with and (suspected) exposure to covid-19: Secondary | ICD-10-CM | POA: Diagnosis not present

## 2020-05-22 NOTE — Progress Notes (Signed)
Virtual Visit via Caregility  This visit type was conducted due to national recommendations for restrictions regarding the COVID-19 pandemic (e.g. social distancing).  This format is felt to be most appropriate for this patient at this time.  All issues noted in this document were discussed and addressed.  No physical exam was performed (except for noted visual exam findings with Video Visits).   I connected with@ on 05/22/20 at 11:30 AM EDT by a video enabled telemedicine application and verified that I am speaking with the correct person using two identifiers. Location patient: home Location provider: work or home office Persons participating in the virtual visit: patient, provider  I discussed the limitations, risks, security and privacy concerns of performing an evaluation and management service by telephone and the availability of in person appointments. I also discussed with the patient that there may be a patient responsible charge related to this service. The patient expressed understanding and agreed to proceed.  Reason for visit: Recent COVID EXPOSURE, increased stress   HPI:  64 YR OLD FEMALE  WITH History of allergic rhinitis,  Vaccinated fully against COVID in Feb, presents with   1) positive home test and negative PCR following an exposure on Saturday  Sept 19th .  She has had no symptoms other than a runny nose and congestion.   2) grief:  This has been a tumultuous year with several major life changes . 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  She also was very disappointed to find out that her Elesa Hacker is  losing their pastor Clover Mealy, who is moving with family out of state.     ROS: See pertinent positives and negatives per HPI.  Past Medical History:  Diagnosis Date  . Allergy   . Anemia   . Asthma   .  Fibrocystic breast disease   . History of migraine headaches   . Hyperlipidemia   . Thyroid disease   . Vitamin D deficiency     Past Surgical History:  Procedure Laterality Date  . APPENDECTOMY  1967   secondary to rupture  . BREAST CYST ASPIRATION Left 2009   neg  . chin implant  1980s  . FRONTALIS SUSPENSION  2008  . HERNIA REPAIR  2008    Family History  Problem Relation Age of Onset  . Stroke Mother 39  . Cancer Mother        unknown tyoe  . Osteoporosis Mother   . Breast cancer Mother 23  . Heart disease Father 82       AMI, died at 45 from second MI  . Hypertension Father   . Alcohol abuse Brother   . Hypertension Brother     SOCIAL HX:  reports that she has never smoked. She has never used smokeless tobacco. She reports that she does not drink alcohol and does not use drugs.   Current Outpatient Medications:  .  Aspirin-Acetaminophen-Caffeine (EXCEDRIN PO), Take by mouth daily as needed , Disp: , Rfl:  .  Calcipotriene-Betameth Diprop (ENSTILAR) 0.005-0.064 % FOAM, Apply 1 application topically daily., Disp: , Rfl:  .  Cholecalciferol (VITAMIN D) 2000 UNITS CAPS, Take one by mouth daily , Disp: , Rfl:  .  loratadine (CLARITIN) 10 MG tablet, Take 10 mg by mouth daily., Disp: , Rfl:  .  montelukast (SINGULAIR) 10 MG tablet, Take  1 tablet (10 mg total) by mouth at bedtime., Disp: 30 tablet, Rfl: 5 .  SUMAtriptan (IMITREX) 100 MG tablet, TAKE 1/2 TABLET BY MOUTH AT ONSET OF MIGRAINE,THEN TAKE 1 TABLET TWO HOURS LATER IF NEEDED. MAX OF 2 TABS PER DAY, Disp: 10 tablet, Rfl: 0 .  SYNTHROID 75 MCG tablet, TAKE 1 TABLET EVERY DAY ON EMPTY STOMACHWITH A GLASS OF WATER AT LEAST 30-60 MINBEFORE BREAKFAST, Disp: 90 tablet, Rfl: 1  EXAM:  VITALS per patient if applicable:  GENERAL: alert, oriented, appears well and in no acute distress  HEENT: atraumatic, conjunttiva clear, no obvious abnormalities on inspection of external nose and ears  NECK: normal movements of the  head and neck  LUNGS: on inspection no signs of respiratory distress, breathing rate appears normal, no obvious gross SOB, gasping or wheezing  CV: no obvious cyanosis  MS: moves all visible extremities without noticeable abnormality  PSYCH/NEURO: pleasant and cooperative, no obvious depression or anxiety, speech and thought processing grossly intact  ASSESSMENT AND PLAN:  Discussed the following assessment and plan:  Exposure to COVID-19 virus  Anxiety as acute reaction to exceptional stress  Exposure to COVID-19 virus She has had no symptoms that have been unusual for her given her history of allergic rhinitis 6 days post exposure,  With a negative PCR following a positive home test.  Encouraged to monitor temperature,  Continue masking and retest if additional symptoms develop  Anxiety as acute reaction to exceptional stress Secondary to major life changes.  She is not having insomnia or panic attacks.  She declines medication     I discussed the assessment and treatment plan with the patient. The patient was provided an opportunity to ask questions and all were answered. The patient agreed with the plan and demonstrated an understanding of the instructions.   The patient was advised to call back or seek an in-person evaluation if the symptoms worsen or if the condition fails to improve as anticipated.  I provided 30 minutes of face-to-face time during this encounter.   Sherlene Shams, MD

## 2020-05-24 DIAGNOSIS — F411 Generalized anxiety disorder: Secondary | ICD-10-CM | POA: Insufficient documentation

## 2020-05-24 DIAGNOSIS — Z20822 Contact with and (suspected) exposure to covid-19: Secondary | ICD-10-CM | POA: Insufficient documentation

## 2020-05-24 NOTE — Assessment & Plan Note (Signed)
She has had no symptoms that have been unusual for her given her history of allergic rhinitis 6 days post exposure,  With a negative PCR following a positive home test.  Encouraged to monitor temperature,  Continue masking and retest if additional symptoms develop

## 2020-05-24 NOTE — Assessment & Plan Note (Addendum)
Secondary to major life changes.  She is not having insomnia or panic attacks.  She declines medication

## 2020-09-24 ENCOUNTER — Encounter: Payer: Self-pay | Admitting: Internal Medicine

## 2020-10-08 ENCOUNTER — Ambulatory Visit (INDEPENDENT_AMBULATORY_CARE_PROVIDER_SITE_OTHER): Payer: PRIVATE HEALTH INSURANCE | Admitting: Internal Medicine

## 2020-10-08 ENCOUNTER — Other Ambulatory Visit (HOSPITAL_COMMUNITY)
Admission: RE | Admit: 2020-10-08 | Discharge: 2020-10-08 | Disposition: A | Discharge status: Home / Self Care | Payer: 59 | Source: Ambulatory Visit | Attending: Internal Medicine | Admitting: Internal Medicine

## 2020-10-08 ENCOUNTER — Encounter: Payer: Self-pay | Admitting: Internal Medicine

## 2020-10-08 ENCOUNTER — Other Ambulatory Visit: Payer: Self-pay

## 2020-10-08 VITALS — BP 124/82 | HR 97 | Temp 98.1°F | Ht 62.99 in | Wt 123.4 lb

## 2020-10-08 DIAGNOSIS — Z124 Encounter for screening for malignant neoplasm of cervix: Secondary | ICD-10-CM

## 2020-10-08 DIAGNOSIS — R5383 Other fatigue: Secondary | ICD-10-CM

## 2020-10-08 DIAGNOSIS — E034 Atrophy of thyroid (acquired): Secondary | ICD-10-CM | POA: Diagnosis not present

## 2020-10-08 DIAGNOSIS — J309 Allergic rhinitis, unspecified: Secondary | ICD-10-CM

## 2020-10-08 DIAGNOSIS — F5101 Primary insomnia: Secondary | ICD-10-CM | POA: Diagnosis not present

## 2020-10-08 DIAGNOSIS — Z0000 Encounter for general adult medical examination without abnormal findings: Secondary | ICD-10-CM

## 2020-10-08 DIAGNOSIS — E559 Vitamin D deficiency, unspecified: Secondary | ICD-10-CM | POA: Diagnosis not present

## 2020-10-08 DIAGNOSIS — N952 Postmenopausal atrophic vaginitis: Secondary | ICD-10-CM | POA: Diagnosis not present

## 2020-10-08 LAB — COMPREHENSIVE METABOLIC PANEL
ALT: 15 U/L (ref 0–35)
AST: 18 U/L (ref 0–37)
Albumin: 4.5 g/dL (ref 3.5–5.2)
Alkaline Phosphatase: 61 U/L (ref 39–117)
BUN: 17 mg/dL (ref 6–23)
CO2: 30 mEq/L (ref 19–32)
Calcium: 10 mg/dL (ref 8.4–10.5)
Chloride: 100 mEq/L (ref 96–112)
Creatinine, Ser: 0.79 mg/dL (ref 0.40–1.20)
GFR: 79.32 mL/min (ref >60.00)
Glucose, Bld: 81 mg/dL (ref 70–99)
Potassium: 4.3 mEq/L (ref 3.5–5.1)
Sodium: 137 mEq/L (ref 135–145)
Total Bilirubin: 0.6 mg/dL (ref 0.2–1.2)
Total Protein: 6.9 g/dL (ref 6.0–8.3)

## 2020-10-08 LAB — CBC WITH DIFFERENTIAL/PLATELET
Basophils Absolute: 0 10*3/uL (ref 0.0–0.1)
Basophils Relative: 1 % (ref 0.0–3.0)
Eosinophils Absolute: 0.1 10*3/uL (ref 0.0–0.7)
Eosinophils Relative: 2.9 % (ref 0.0–5.0)
HCT: 40.5 % (ref 36.0–46.0)
Hemoglobin: 13.9 g/dL (ref 12.0–15.0)
Lymphocytes Relative: 29 % (ref 12.0–46.0)
Lymphs Abs: 1.1 10*3/uL (ref 0.7–4.0)
MCHC: 34.4 g/dL (ref 30.0–36.0)
MCV: 99 fl (ref 78.0–100.0)
Monocytes Absolute: 0.4 10*3/uL (ref 0.1–1.0)
Monocytes Relative: 11.5 % (ref 3.0–12.0)
Neutro Abs: 2.1 10*3/uL (ref 1.4–7.7)
Neutrophils Relative %: 55.6 % (ref 43.0–77.0)
Platelets: 208 10*3/uL (ref 150.0–400.0)
RBC: 4.1 Mil/uL (ref 3.87–5.11)
RDW: 12.5 % (ref 11.5–15.5)
WBC: 3.8 10*3/uL — ABNORMAL LOW (ref 4.0–10.5)

## 2020-10-08 LAB — LIPID PANEL
Cholesterol: 182 mg/dL (ref 0–200)
HDL: 79.8 mg/dL (ref >39.00)
LDL Cholesterol: 94 mg/dL (ref 0–99)
NonHDL: 102.42
Total CHOL/HDL Ratio: 2
Triglycerides: 43 mg/dL (ref 0.0–149.0)
VLDL: 8.6 mg/dL (ref 0.0–40.0)

## 2020-10-08 LAB — VITAMIN D 25 HYDROXY (VIT D DEFICIENCY, FRACTURES): VITD: 47.8 ng/mL (ref 30.00–100.00)

## 2020-10-08 LAB — TSH: TSH: 1.72 u[IU]/mL (ref 0.35–4.50)

## 2020-10-08 LAB — VITAMIN B12: Vitamin B-12: 320 pg/mL (ref 211–911)

## 2020-10-08 MED ORDER — SPIRIVA HANDIHALER 18 MCG IN CAPS
18.0000 ug | ORAL_CAPSULE | Freq: Every day | RESPIRATORY_TRACT | 0 refills | Status: DC
Start: 2020-10-08 — End: 2021-04-07

## 2020-10-08 NOTE — Patient Instructions (Signed)
I recommend trying melatonin for your insomnia.  It is not a sedative,  But must be taken on  a regular basis to help your internal clock.  Take every evening after dinner start with 3 mg dose   Max effective dose is 10 mg  We discussed a trial of once daily Spiriva for the moist cough.  Our Plan B wil be a trial of atrovent nasal spray     Health Maintenance, Female Adopting a healthy lifestyle and getting preventive care are important in promoting health and wellness. Ask your health care provider about:  The right schedule for you to have regular tests and exams.  Things you can do on your own to prevent diseases and keep yourself healthy. What should I know about diet, weight, and exercise? Eat a healthy diet  Eat a diet that includes plenty of vegetables, fruits, low-fat dairy products, and lean protein.  Do not eat a lot of foods that are high in solid fats, added sugars, or sodium.   Maintain a healthy weight Body mass index (BMI) is used to identify weight problems. It estimates body fat based on height and weight. Your health care provider can help determine your BMI and help you achieve or maintain a healthy weight. Get regular exercise Get regular exercise. This is one of the most important things you can do for your health. Most adults should:  Exercise for at least 150 minutes each week. The exercise should increase your heart rate and make you sweat (moderate-intensity exercise).  Do strengthening exercises at least twice a week. This is in addition to the moderate-intensity exercise.  Spend less time sitting. Even light physical activity can be beneficial. Watch cholesterol and blood lipids Have your blood tested for lipids and cholesterol at 64 years of age, then have this test every 5 years. Have your cholesterol levels checked more often if:  Your lipid or cholesterol levels are high.  You are older than 64 years of age.  You are at high risk for heart  disease. What should I know about cancer screening? Depending on your health history and family history, you may need to have cancer screening at various ages. This may include screening for:  Breast cancer.  Cervical cancer.  Colorectal cancer.  Skin cancer.  Lung cancer. What should I know about heart disease, diabetes, and high blood pressure? Blood pressure and heart disease  High blood pressure causes heart disease and increases the risk of stroke. This is more likely to develop in people who have high blood pressure readings, are of African descent, or are overweight.  Have your blood pressure checked: ? Every 3-5 years if you are 70-81 years of age. ? Every year if you are 35 years old or older. Diabetes Have regular diabetes screenings. This checks your fasting blood sugar level. Have the screening done:  Once every three years after age 61 if you are at a normal weight and have a low risk for diabetes.  More often and at a younger age if you are overweight or have a high risk for diabetes. What should I know about preventing infection? Hepatitis B If you have a higher risk for hepatitis B, you should be screened for this virus. Talk with your health care provider to find out if you are at risk for hepatitis B infection. Hepatitis C Testing is recommended for:  Everyone born from [birth year] through 1964.  Anyone with known risk factors for hepatitis C. Sexually transmitted infections (  STIs)  Get screened for STIs, including gonorrhea and chlamydia, if: ? You are sexually active and are younger than 64 years of age. ? You are older than 64 years of age and your health care provider tells you that you are at risk for this type of infection. ? Your sexual activity has changed since you were last screened, and you are at increased risk for chlamydia or gonorrhea. Ask your health care provider if you are at risk.  Ask your health care provider about whether you are at high  risk for HIV. Your health care provider may recommend a prescription medicine to help prevent HIV infection. If you choose to take medicine to prevent HIV, you should first get tested for HIV. You should then be tested every 3 months for as long as you are taking the medicine. Pregnancy  If you are about to stop having your period (premenopausal) and you may become pregnant, seek counseling before you get pregnant.  Take 400 to 800 micrograms (mcg) of folic acid every day if you become pregnant.  Ask for birth control (contraception) if you want to prevent pregnancy. Osteoporosis and menopause Osteoporosis is a disease in which the bones lose minerals and strength with aging. This can result in bone fractures. If you are 6 years old or older, or if you are at risk for osteoporosis and fractures, ask your health care provider if you should:  Be screened for bone loss.  Take a calcium or vitamin D supplement to lower your risk of fractures.  Be given hormone replacement therapy (HRT) to treat symptoms of menopause. Follow these instructions at home: Lifestyle  Do not use any products that contain nicotine or tobacco, such as cigarettes, e-cigarettes, and chewing tobacco. If you need help quitting, ask your health care provider.  Do not use street drugs.  Do not share needles.  Ask your health care provider for help if you need support or information about quitting drugs. Alcohol use  Do not drink alcohol if: ? Your health care provider tells you not to drink. ? You are pregnant, may be pregnant, or are planning to become pregnant.  If you drink alcohol: ? Limit how much you use to 0-1 drink a day. ? Limit intake if you are breastfeeding.  Be aware of how much alcohol is in your drink. In the [place] one drink equals one 12 oz bottle of beer (355 mL), one 5 oz glass of wine (148 mL), or one 1 oz glass of hard liquor (44 mL). General instructions  Schedule regular health, dental,  and eye exams.  Stay current with your vaccines.  Tell your health care provider if: ? You often feel depressed. ? You have ever been abused or do not feel safe at home. Summary  Adopting a healthy lifestyle and getting preventive care are important in promoting health and wellness.  Follow your health care provider's instructions about healthy diet, exercising, and getting tested or screened for diseases.  Follow your health care provider's instructions on monitoring your cholesterol and blood pressure. This information is not intended to replace advice given to you by your health care provider. Make sure you discuss any questions you have with your health care provider. Document Revised: 08/09/2018 Document Reviewed: 08/09/2018 Elsevier Patient Education  2021 [employer].

## 2020-10-08 NOTE — Progress Notes (Signed)
Patient ID: Caitlyn Gregory, female    DOB: [birth year]  Age: 65 y.o. MRN: [id number]  The patient is here for annual preventive examination and management of other chronic and acute problems.   The risk factors are reflected in the social history.  The roster of all physicians providing medical care to patient - is listed in the Snapshot section of the chart.  Activities of daily living:  The patient is 100% independent in all ADLs: dressing, toileting, feeding as well as independent mobility  Home safety : The patient has smoke detectors in the home. They wear seatbelts.  There are no firearms at home. There is no violence in the home.   There is no risks for hepatitis, STDs or HIV. There is no   history of blood transfusion. They have no travel history to infectious disease endemic areas of the world.  The patient has seen their dentist in the last six month. They have seen their eye doctor in the last year. They admit to slight hearing difficulty with regard to whispered voices and some television programs.  They have deferred audiologic testing in the last year.  They do not  have excessive sun exposure. Discussed the need for sun protection: hats, long sleeves and use of sunscreen if there is significant sun exposure.   Diet: the importance of a healthy diet is discussed. They do have a healthy diet.  The benefits of regular aerobic exercise were discussed. She exercises regularly .   Depression screen: there are no signs or vegative symptoms of depression- irritability, change in appetite, anhedonia, sadness/tearfullness.  Cognitive assessment: the patient manages all their financial and personal affairs and is actively engaged. They could relate day,date,year and events; recalled 2/3 objects at 3 minutes; performed clock-face test normally.  The following portions of the patient's history were reviewed and updated as appropriate: allergies, current medications, past family history, past  medical history,  pasusing t surgical history, past social history  and problem list.  Visual acuity was not assessed per patient preference since she has regular follow up with her ophthalmologist. Hearing and body mass index were assessed and reviewed.   During the course of the visit the patient was educated and counseled about appropriate screening and preventive services including : fall prevention , diabetes screening, nutrition counseling, colorectal cancer screening, and recommended immunizations.    CC: The primary encounter diagnosis was Visit for preventive health examination. Diagnoses of Fatigue, unspecified type, Hypothyroidism due to acquired atrophy of thyroid, Postmenopausal atrophic vaginitis, Cervical cancer screening, Vitamin D deficiency, Allergic rhinitis, unspecified seasonality, unspecified trigger, and Primary insomnia were also pertinent to this visit.   1) asthma:  She reports Wheezing and coughHas a lot of post nasal dri.  p causing a cough.  Has seen McQuaid who did not agree with diagnosis of asthma   2) HOLE IN nasal septum ,  Has seen mCqueen  In the past    History Cherine has a past medical history of Allergy, Anemia, Asthma, Fibrocystic breast disease, History of migraine headaches, Hyperlipidemia, Thyroid disease, and Vitamin D deficiency.   She has a past surgical history that includes chin implant (1980s); Appendectomy (1967); Frontalis suspension (2008); Hernia repair (2008); and Breast cyst aspiration (Left, 2009).   Her family history includes Alcohol abuse in her brother; Breast cancer (age of onset: 30) in her mother; Cancer in her mother; Heart disease (age of onset: 73) in her father; Hypertension in her brother and father; Osteoporosis in her  mother; Stroke (age of onset: 6) in her mother.She reports that she has never smoked. She has never used smokeless tobacco. She reports that she does not drink alcohol and does not use drugs.  Outpatient  Medications Prior to Visit  Medication Sig Dispense Refill  . Aspirin-Acetaminophen-Caffeine (EXCEDRIN PO) Take by mouth daily as needed    . Calcipotriene-Betameth Diprop 0.005-0.064 % FOAM Apply 1 application topically daily.    . Cholecalciferol (VITAMIN D) 2000 UNITS CAPS Take one by mouth daily    . fluticasone (FLOVENT HFA) 44 MCG/ACT inhaler     . loratadine (CLARITIN) 10 MG tablet Take 10 mg by mouth daily.    . montelukast (SINGULAIR) 10 MG tablet Take 1 tablet (10 mg total) by mouth at bedtime. 30 tablet 5  . pseudoephedrine-guaifenesin (MUCINEX D) 60-600 MG 12 hr tablet     . SUMAtriptan (IMITREX) 100 MG tablet TAKE 1/2 TABLET BY MOUTH AT ONSET OF MIGRAINE,THEN TAKE 1 TABLET TWO HOURS LATER IF NEEDED. MAX OF 2 TABS PER DAY 10 tablet 0  . SYNTHROID 75 MCG tablet TAKE 1 TABLET EVERY DAY ON EMPTY STOMACHWITH A GLASS OF WATER AT LEAST 30-60 MINBEFORE BREAKFAST 90 tablet 1   No facility-administered medications prior to visit.    Review of Systems   Patient denies headache, fevers, malaise, unintentional weight loss, skin rash, eye pain, sinus congestion and sinus pain, sore throat, dysphagia,  hemoptysis , cough, dyspnea, wheezing, chest pain, palpitations, orthopnea, edema, abdominal pain, nausea, melena, diarrhea, constipation, flank pain, dysuria, hematuria, urinary  Frequency, nocturia, numbness, tingling, seizures,  Focal weakness, Loss of consciousness,  Tremor, insomnia, depression, anxiety, and suicidal ideation.      Objective:  BP 124/82 (BP Location: Left Arm, Patient Position: Sitting)   Pulse 97   Temp 98.1 F (36.7 C)   Ht 5' 2.99" (1.6 m)   Wt 123 lb 6.4 oz (56 kg)   SpO2 97%   BMI 21.86 kg/m   Physical Exam  General Appearance:    Alert, cooperative, no distress, appears stated age  Head:    Normocephalic, without obvious abnormality, atraumatic  Eyes:    PERRL, conjunctiva/corneas clear, EOM's intact, fundi    benign, both eyes  Ears:    Normal TM's and  external ear canals, both ears  Nose:   Nares normal, septum midline, mucosa normal, no drainage    or sinus tenderness  Throat:   Lips, mucosa, and tongue normal; teeth and gums normal  Neck:   Supple, symmetrical, trachea midline, no adenopathy;    thyroid:  no enlargement/tenderness/nodules; no carotid   bruit or JVD  Back:     Symmetric, no curvature, ROM normal, no CVA tenderness  Lungs:     Clear to auscultation bilaterally, respirations unlabored  Chest Wall:    No tenderness or deformity   Heart:    Regular rate and rhythm, S1 and S2 normal, no murmur, rub   or gallop  Breast Exam:    No tenderness, masses, or nipple abnormality  Abdomen:     Soft, non-tender, bowel sounds active all four quadrants,    no masses, no organomegaly  Genitalia:    Pelvic: cervix normal in appearance, external genitalia normal, no adnexal masses or tenderness, no cervical motion tenderness, rectovaginal septum normal, uterus normal size, shape, and consistency and vagina normal without discharge  Extremities:   Extremities normal, atraumatic, no cyanosis or edema  Pulses:   2+ and symmetric all extremities  Skin:   Skin  color, texture, turgor normal, no rashes or lesions  Lymph nodes:   Cervical, supraclavicular, and axillary nodes normal  Neurologic:   CNII-XII intact, normal strength, sensation and reflexes    throughout    Assessment & Plan:   Problem List Items Addressed This Visit      Unprioritized   Hypothyroidism    Thyroid function is WNL on current dose.  No current changes needed.       Relevant Orders   TSH (Completed)   Insomnia    I recommend trying melatonin for her insomnia, taken in the  evening after dinner starting  with 5 mg dose   Max effective dose is 10 mg.  She has also been encouraged to try using " Headspace".  This  is a phone app that teaches patients to meditate.        Postmenopausal atrophic vaginitis   Rhinitis, allergic    Continue claritin daily.  Did not  tolerate singulair due to personality changes.   avoidiing steroid nasals sprays due to perforated septum .  Trial of spiriva for moist cough and report of wheezing, although today's exam suggests vocal cord dysfunction        Visit for preventive health examination - Primary    age appropriate education and counseling updated, referrals for preventative services and immunizations addressed, dietary and smoking counseling addressed, most recent labs reviewed.  I have personally reviewed and have noted:  1) the patient's medical and social history 2) The pt's use of alcohol, tobacco, and illicit drugs 3) The patient's current medications and supplements 4) Functional ability including ADL's, fall risk, home safety risk, hearing and visual impairment 5) Diet and physical activities 6) Evidence for depression or mood disorder 7) The patient's height, weight, and BMI have been recorded in the chart  I have made referrals, and provided counseling and education based on review of the above      Relevant Orders   Lipid panel (Completed)    Other Visit Diagnoses    Fatigue, unspecified type       Relevant Orders   CBC with Differential/Platelet (Completed)   Comprehensive metabolic panel (Completed)   Vitamin B12 (Completed)   Cervical cancer screening       Relevant Orders   Cytology - PAP (Completed)   Vitamin D deficiency       Relevant Orders   VITAMIN D 25 Hydroxy (Vit-D Deficiency, Fractures) (Completed)      I am having Brianny H. Sterba start on Spiriva HandiHaler. I am also having her maintain her Vitamin D, Aspirin-Acetaminophen-Caffeine (EXCEDRIN PO), Calcipotriene-Betameth Diprop, loratadine, montelukast, SUMAtriptan, Synthroid, Flovent HFA, and pseudoephedrine-guaifenesin.  Meds ordered this encounter  Medications  . tiotropium (SPIRIVA HANDIHALER) 18 MCG inhalation capsule    Sig: Place 1 capsule (18 mcg total) into inhaler and inhale daily.    Dispense:  30 capsule     Refill:  0    There are no discontinued medications.  Follow-up: Return in about 6 months (around 04/07/2021).   Sherlene Shams, MD

## 2020-10-09 DIAGNOSIS — G4700 Insomnia, unspecified: Secondary | ICD-10-CM | POA: Insufficient documentation

## 2020-10-09 LAB — CYTOLOGY - PAP
Comment: NEGATIVE
Diagnosis: NEGATIVE
High risk HPV: NEGATIVE

## 2020-10-09 NOTE — Assessment & Plan Note (Signed)
Thyroid function is WNL on current dose.  No current changes needed.  

## 2020-10-09 NOTE — Assessment & Plan Note (Signed)

## 2020-10-09 NOTE — Assessment & Plan Note (Signed)
Continue claritin daily.  Did not tolerate singulair due to personality changes.   avoidiing steroid nasals sprays due to perforated septum .  Trial of spiriva for moist cough and report of wheezing, although today's exam suggests vocal cord dysfunction

## 2020-10-09 NOTE — Assessment & Plan Note (Signed)
I recommend trying melatonin for her insomnia, taken in the  evening after dinner starting  with 5 mg dose   Max effective dose is 10 mg.  She has also been encouraged to try using " Headspace".  This  is a phone app that teaches patients to meditate.

## 2020-11-14 ENCOUNTER — Other Ambulatory Visit: Payer: Self-pay | Admitting: Internal Medicine

## 2020-11-26 ENCOUNTER — Other Ambulatory Visit: Payer: Self-pay | Admitting: Internal Medicine

## 2020-11-26 DIAGNOSIS — G43809 Other migraine, not intractable, without status migrainosus: Secondary | ICD-10-CM

## 2021-03-26 ENCOUNTER — Other Ambulatory Visit: Payer: Self-pay | Admitting: Internal Medicine

## 2021-03-26 DIAGNOSIS — G43809 Other migraine, not intractable, without status migrainosus: Secondary | ICD-10-CM

## 2021-04-07 ENCOUNTER — Ambulatory Visit: Payer: PRIVATE HEALTH INSURANCE | Admitting: Internal Medicine

## 2021-04-07 ENCOUNTER — Encounter: Payer: Self-pay | Admitting: Family Medicine

## 2021-04-07 ENCOUNTER — Telehealth (INDEPENDENT_AMBULATORY_CARE_PROVIDER_SITE_OTHER): Payer: PRIVATE HEALTH INSURANCE | Admitting: Family Medicine

## 2021-04-07 VITALS — Wt 120.0 lb

## 2021-04-07 DIAGNOSIS — R059 Cough, unspecified: Secondary | ICD-10-CM | POA: Diagnosis not present

## 2021-04-07 DIAGNOSIS — R519 Headache, unspecified: Secondary | ICD-10-CM

## 2021-04-07 DIAGNOSIS — R0981 Nasal congestion: Secondary | ICD-10-CM

## 2021-04-07 MED ORDER — DOXYCYCLINE HYCLATE 100 MG PO TABS
100.0000 mg | ORAL_TABLET | Freq: Two times a day (BID) | ORAL | 0 refills | Status: DC
Start: 2021-04-07 — End: 2021-05-14

## 2021-04-07 MED ORDER — BENZONATATE 100 MG PO CAPS
100.0000 mg | ORAL_CAPSULE | Freq: Three times a day (TID) | ORAL | 0 refills | Status: DC | PRN
Start: 2021-04-07 — End: 2021-05-14

## 2021-04-07 NOTE — Progress Notes (Signed)
Virtual Visit via Video Note  I connected with Caitlyn Gregory  on 04/07/21 at 11:40 AM EDT by a video enabled telemedicine application and verified that I am speaking with the correct person using two identifiers.  Location patient: home, [place] Location provider:work or home office Persons participating in the virtual visit: patient, provider  I discussed the limitations of evaluation and management by telemedicine and the availability of in person appointments. The patient expressed understanding and agreed to proceed.   HPI:  Acute telemedicine visit for sinus congestion: -Onset: over 1 week ago; has done a covid test which was negative -Symptoms include: cough, sinus pressure and congestion, fever, had a few chills last week - sinus discomfort has worsened and has thick green sinus drainage and pain in sinuses when leaning forward -Denies:CP, SOB, NVD, inability to eat/drink/get out of bed -Has tried:claritin, tylenol and advil -Pertinent past medical history: had root canal last week -Pertinent medication allergies: Allergies  Allergen Reactions   Penicillins Rash   Sulfa Antibiotics Rash   Sulfasalazine Rash  -COVID-19 vaccine status: vaccinated x2 and had one booster  ROS: See pertinent positives and negatives per HPI.  Past Medical History:  Diagnosis Date   Allergy    Anemia    Asthma    Fibrocystic breast disease    History of migraine headaches    Hyperlipidemia    Thyroid disease    Vitamin D deficiency     Past Surgical History:  Procedure Laterality Date   APPENDECTOMY  1967   secondary to rupture   BREAST CYST ASPIRATION Left 2009   neg   chin implant  1980s   FRONTALIS SUSPENSION  2008   HERNIA REPAIR  2008     Current Outpatient Medications:    Ascorbic Acid (VITAMIN C) 1000 MG tablet, Take 1,000 mg by mouth daily., Disp: , Rfl:    benzonatate (TESSALON PERLES) 100 MG capsule, Take 1 capsule (100 mg total) by mouth 3 (three) times daily as needed., Disp: 20  capsule, Rfl: 0   Calcipotriene-Betameth Diprop 0.005-0.064 % FOAM, Apply 1 application topically daily., Disp: , Rfl:    Cholecalciferol (VITAMIN D) 2000 UNITS CAPS, Take one by mouth daily, Disp: , Rfl:    doxycycline (VIBRA-TABS) 100 MG tablet, Take 1 tablet (100 mg total) by mouth 2 (two) times daily., Disp: 14 tablet, Rfl: 0   loratadine (CLARITIN) 10 MG tablet, Take 10 mg by mouth daily., Disp: , Rfl:    Probiotic Product (PROBIOTIC PO), Take by mouth daily., Disp: , Rfl:    pseudoephedrine-guaifenesin (MUCINEX D) 60-600 MG 12 hr tablet, , Disp: , Rfl:    QUERCETIN PO, Take by mouth., Disp: , Rfl:    SUMAtriptan (IMITREX) 100 MG tablet, TAKE 1/2 TABLET AT ONSET OF MIGRAINE THEN TAKE 1 TABLET 2 HOURS LATER IF NEEDED MAX OF 2 TABLETS PER DAY, Disp: 10 tablet, Rfl: 0   SYNTHROID 75 MCG tablet, TAKE 1 TABLET EVERY DAY ON EMPTY STOMACHWITH A GLASS OF WATER AT LEAST 30-60 MINBEFORE BREAKFAST, Disp: 90 tablet, Rfl: 1   zinc gluconate 50 MG tablet, Take 50 mg by mouth daily., Disp: , Rfl:   EXAM:  VITALS per patient if applicable:  GENERAL: alert, oriented, appears well and in no acute distress  HEENT: atraumatic, conjunttiva clear, no obvious abnormalities on inspection of external nose and ears  NECK: normal movements of the head and neck  LUNGS: on inspection no signs of respiratory distress, breathing rate appears normal, no obvious gross SOB,  gasping or wheezing  CV: no obvious cyanosis  MS: moves all visible extremities without noticeable abnormality  PSYCH/NEURO: pleasant and cooperative, no obvious depression or anxiety, speech and thought processing grossly intact  ASSESSMENT AND PLAN:  Discussed the following assessment and plan:  Nasal congestion  Facial discomfort  Cough  -we discussed possible serious and likely etiologies, options for evaluation and workup, limitations of telemedicine visit vs in person visit, treatment, treatment risks and precautions. Pt prefers  to treat via telemedicine empirically rather than in person at this moment. Query VURI, developing bacterial sinusitis vs other. She has opted to try nasal saline, tessalon for cough and 3 day course of nasal decongestant with doxy 100mg  bid x 7 days if worsening or not improving. Advised to seek prompt in person care if worsening, new symptoms arise, or if is not improving with treatment. Discussed options for inperson care if PCP office not available. Did let this patient know that I only do telemedicine on Tuesdays and Thursdays for [facility]. Advised to schedule follow up visit with PCP or [facility] if any further questions or concerns to avoid delays in care.   I discussed the assessment and treatment plan with the patient. The patient was provided an opportunity to ask questions and all were answered. The patient agreed with the plan and demonstrated an understanding of the instructions.     01-30-1979, DO

## 2021-04-07 NOTE — Patient Instructions (Signed)
-  I sent the medication(s) we discussed to your pharmacy: Meds ordered this encounter  Medications   doxycycline (VIBRA-TABS) 100 MG tablet    Sig: Take 1 tablet (100 mg total) by mouth 2 (two) times daily.    Dispense:  14 tablet    Refill:  0   benzonatate (TESSALON PERLES) 100 MG capsule    Sig: Take 1 capsule (100 mg total) by mouth 3 (three) times daily as needed.    Dispense:  20 capsule    Refill:  0     I hope you are feeling better soon!  Seek in person care promptly if your symptoms worsen, new concerns arise or you are not improving with treatment.  It was nice to meet you today. I help [facility] out with telemedicine visits on Tuesdays and Thursdays and am available for visits on those days. If you have any concerns or questions following this visit please schedule a follow up visit with your Primary Care doctor or seek care at a local urgent care clinic to avoid delays in care.   

## 2021-05-14 ENCOUNTER — Encounter: Payer: Self-pay | Admitting: Internal Medicine

## 2021-05-14 ENCOUNTER — Telehealth (INDEPENDENT_AMBULATORY_CARE_PROVIDER_SITE_OTHER): Payer: PRIVATE HEALTH INSURANCE | Admitting: Internal Medicine

## 2021-05-14 VITALS — Ht 62.0 in | Wt 120.0 lb

## 2021-05-14 DIAGNOSIS — F430 Acute stress reaction: Secondary | ICD-10-CM

## 2021-05-14 DIAGNOSIS — E559 Vitamin D deficiency, unspecified: Secondary | ICD-10-CM

## 2021-05-14 DIAGNOSIS — E782 Mixed hyperlipidemia: Secondary | ICD-10-CM | POA: Diagnosis not present

## 2021-05-14 DIAGNOSIS — E034 Atrophy of thyroid (acquired): Secondary | ICD-10-CM | POA: Diagnosis not present

## 2021-05-14 DIAGNOSIS — F411 Generalized anxiety disorder: Secondary | ICD-10-CM

## 2021-05-14 NOTE — Progress Notes (Signed)
Virtual Visit via converted to telephone  Note  This visit type was conducted due to national recommendations for restrictions regarding the COVID-19 pandemic (e.g. social distancing).  This format is felt to be most appropriate for this patient at this time.  All issues noted in this document were discussed and addressed.  No physical exam was performed (except for noted visual exam findings with Video Visits).   I connected withNAME@ on 05/14/21 at 10:30 AM EDT by  telephone and verified that I am speaking with the correct person using two identifiers. Location patient: home Location provider: work or home office Persons participating in the virtual visit: patient, provider  I discussed the limitations, risks, security and privacy concerns of performing an evaluation and management service by telephone and the availability of in person appointments. I also discussed with the patient that there may be a patient responsible charge related to this service. The patient expressed understanding and agreed to proceed.  Reason for visit:follow up  HPI:  Woke up with sore throat this morning.  Allergies have been difficult lately . Taking immunotherapy.  Exposure to sick grandbaby  Bad allergies.  Did not tolerated singulair due to change in personality    Recovered from a cold in august ,  3 weeks of profound fatigue. Now resolved.   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  And  loss of minister /friend Clover Mealy   Daughter [place] moving back to [place].  [name] has finally "come out" with her homosexuality after relapsing in January     ROS: See pertinent positives and negatives per HPI.  Past Medical History:  Diagnosis Date   Allergy    Anemia    Asthma    Fibrocystic breast disease    History of migraine  headaches    Hyperlipidemia    Thyroid disease    Vitamin D deficiency     Past Surgical History:  Procedure Laterality Date   APPENDECTOMY  1967   secondary to rupture   BREAST CYST ASPIRATION Left 2009   neg   chin implant  1980s   FRONTALIS SUSPENSION  2008   HERNIA REPAIR  2008    Family History  Problem Relation Age of Onset   Stroke Mother 63   Cancer Mother        unknown tyoe   Osteoporosis Mother    Breast cancer Mother 41   Heart disease Father 52       AMI, died at 55 from second MI   Hypertension Father    Alcohol abuse Brother    Hypertension Brother     SOCIAL HX:  reports that she has never smoked. She has never used smokeless tobacco. She reports that she does not drink alcohol and does not use drugs.    Current Outpatient Medications:    Ascorbic Acid (VITAMIN C) 1000 MG tablet, Take 1,000 mg by mouth daily., Disp: , Rfl:    Calcipotriene-Betameth Diprop 0.005-0.064 % FOAM, Apply 1 application topically daily., Disp: , Rfl:    Cholecalciferol (VITAMIN D) 2000 UNITS CAPS, Take one by mouth daily, Disp: , Rfl:    diphenhydrAMINE (BENADRYL) 25 MG tablet, Take 25 mg by mouth at bedtime as needed., Disp: , Rfl:    Glucosamine-Chondroitin (OSTEO BI-FLEX REGULAR STRENGTH PO), Take 1 tablet by mouth daily., Disp: ,  Rfl:    loratadine (CLARITIN) 10 MG tablet, Take 10 mg by mouth daily., Disp: , Rfl:    Multiple Vitamin (MULTIVITAMIN) tablet, Take 1 tablet by mouth daily., Disp: , Rfl:    Probiotic Product (PROBIOTIC PO), Take by mouth daily., Disp: , Rfl:    pseudoephedrine-guaifenesin (MUCINEX D) 60-600 MG 12 hr tablet, , Disp: , Rfl:    SUMAtriptan (IMITREX) 100 MG tablet, TAKE 1/2 TABLET AT ONSET OF MIGRAINE THEN TAKE 1 TABLET 2 HOURS LATER IF NEEDED MAX OF 2 TABLETS PER DAY, Disp: 10 tablet, Rfl: 0   SYNTHROID 75 MCG tablet, TAKE 1 TABLET EVERY DAY ON EMPTY STOMACHWITH A GLASS OF WATER AT LEAST 30-60 MINBEFORE BREAKFAST, Disp: 90 tablet, Rfl: 1   zinc  gluconate 50 MG tablet, Take 50 mg by mouth daily., Disp: , Rfl:   EXAM:  VITALS per patient if applicable:  GENERAL: alert, oriented, appears well and in no acute distress  HEENT: atraumatic, conjunttiva clear, no obvious abnormalities on inspection of external nose and ears  NECK: normal movements of the head and neck  LUNGS: on inspection no signs of respiratory distress, breathing rate appears normal, no obvious gross SOB, gasping or wheezing  CV: no obvious cyanosis  MS: moves all visible extremities without noticeable abnormality  PSYCH/NEURO: pleasant and cooperative, no obvious depression or anxiety, speech and thought processing grossly intact  ASSESSMENT AND PLAN:  Discussed the following assessment and plan:  Hypothyroidism due to acquired atrophy of thyroid - Plan: TSH  Vitamin D deficiency - Plan: VITAMIN D 25 Hydroxy (Vit-D Deficiency, Fractures)  Moderate mixed hyperlipidemia not requiring statin therapy - Plan: Comprehensive metabolic panel, Lipid panel  Anxiety as acute reaction to exceptional stress  Anxiety as acute reaction to exceptional stress Secondary to major life changes.  She is adjusting to retirement , still dealing with significant stressors relating to financial situations , but enjoying the reunion with one of her daughters. She declines medication .    I discussed the assessment and treatment plan with the patient. The patient was provided an opportunity to ask questions and all were answered. The patient agreed with the plan and demonstrated an understanding of the instructions.   The patient was advised to call back or seek an in-person evaluation if the symptoms worsen or if the condition fails to improve as anticipated.   I provided  25 minutes of non-face-to-face time during this encounter reviewing patient's current problems .  Providing counseling on the above mentioned problems , and coordination  of care .     Caitlyn Shams, MD

## 2021-05-16 NOTE — Assessment & Plan Note (Addendum)
Secondary to major life changes.  She is adjusting to retirement , still dealing with significant stressors relating to financial situations , but enjoying the reunion with one of her daughters. She is sleeping better,  And feeling more optimistic.  She declines medication .

## 2021-06-12 ENCOUNTER — Other Ambulatory Visit: Payer: Self-pay | Admitting: Internal Medicine

## 2021-08-06 ENCOUNTER — Other Ambulatory Visit: Payer: Self-pay | Admitting: Internal Medicine

## 2021-08-27 ENCOUNTER — Telehealth (INDEPENDENT_AMBULATORY_CARE_PROVIDER_SITE_OTHER): Payer: PRIVATE HEALTH INSURANCE | Admitting: Family Medicine

## 2021-08-27 ENCOUNTER — Encounter: Payer: Self-pay | Admitting: Family Medicine

## 2021-08-27 VITALS — Temp 98.0°F

## 2021-08-27 DIAGNOSIS — B9689 Other specified bacterial agents as the cause of diseases classified elsewhere: Secondary | ICD-10-CM

## 2021-08-27 DIAGNOSIS — J208 Acute bronchitis due to other specified organisms: Secondary | ICD-10-CM | POA: Diagnosis not present

## 2021-08-27 MED ORDER — AZITHROMYCIN 250 MG PO TABS
ORAL_TABLET | ORAL | 0 refills | Status: DC
Start: 2021-08-27 — End: 2021-11-10

## 2021-08-27 NOTE — Progress Notes (Signed)
Virtual Visit via Video Note  Subjective  CC:  Chief Complaint  Patient presents with   Fever    Present since 12/23, 100.5 fever   Headache   Cough    Green coming out.    Same day acute visit; PCP not available. New pt to me. Chart reviewed.   I connected with Airanna H Palencia on 08/27/21 at 11:00 AM EST by a video enabled telemedicine application and verified that I am speaking with the correct person using two identifiers. Location patient: vacation home, [place] Location provider: [facility] [address] Office Persons participating in the virtual visit: Alexcis H Tucci, Willow Ora, MD Amedeo Gory CMA  I discussed the limitations of evaluation and management by telemedicine and the availability of in person appointments. The patient expressed understanding and agreed to proceed. HPI: Calypso H Matt is a 65 y.o. female who was contacted today to address the problems listed above in the chief complaint. 65 year old female with hypothyroidism complains of 6-day history of URI symptoms: Has typical cold symptoms however has had 6 days of fever, now low-grade, associated with nasal congestion, sinus pressure, postnasal drainage, and thick productive cough that is worsening.  She feels congested in the chest but denies wheezing, shortness of breath including chest pain.  She has no ear pain or sore throat.  She has tested for COVID twice, most recently yesterday and both test were negative.  She has no COPD or asthma.  She does have allergies.  She is taking over-the-counter supportive medications with some relief.  Because of persistent low-grade fever and thick productive cough, she is requesting further evaluation and help.  Assessment  1. Acute bacterial bronchitis      Plan  Bronchitis: Discussed possible etiologies including viral versus bacterial.  Given persistent fevers and purulent productive coughing, will treat with Z-Pak.  Continue supportive care with  over-the-counter cough medicines and decongestants.  Hydration.  Follow-up if not improving. I discussed the assessment and treatment plan with the patient. The patient was provided an opportunity to ask questions and all were answered. The patient agreed with the plan and demonstrated an understanding of the instructions.   The patient was advised to call back or seek an in-person evaluation if the symptoms worsen or if the condition fails to improve as anticipated. Follow up: As needed Visit date not found  Meds ordered this encounter  Medications   azithromycin (ZITHROMAX) 250 MG tablet    Sig: Take 2 tabs today, then 1 tab daily for 4 days    Dispense:  1 each    Refill:  0      I reviewed the patients updated PMH, FH, and SocHx.    Patient Active Problem List   Diagnosis Date Noted   Insomnia 10/09/2020   Exposure to COVID-19 virus 05/24/2020   Anxiety as acute reaction to exceptional stress 05/24/2020   Family history of colon cancer requiring screening colonoscopy 08/08/2015   Visit for preventive health examination 07/06/2013   Postmenopausal atrophic vaginitis 01/07/2013   Menopausal syndrome 03/29/2012   Screening for cervical cancer 09/02/2011   Migraine headache 09/02/2011   Hypothyroidism 09/02/2011   Rhinitis, allergic 09/02/2011   Screening for colon cancer 09/02/2011   Current Meds  Medication Sig   Ascorbic Acid (VITAMIN C) 1000 MG tablet Take 1,000 mg by mouth daily.   azithromycin (ZITHROMAX) 250 MG tablet Take 2 tabs today, then 1 tab daily for 4 days  Calcipotriene-Betameth Diprop 0.005-0.064 % FOAM Apply 1 application topically daily.   Cholecalciferol (VITAMIN D) 2000 UNITS CAPS Take one by mouth daily   diphenhydrAMINE (BENADRYL) 25 MG tablet Take 25 mg by mouth at bedtime as needed.   loratadine (CLARITIN) 10 MG tablet Take 10 mg by mouth daily.   pseudoephedrine-guaifenesin (MUCINEX D) 60-600 MG 12 hr tablet    SUMAtriptan (IMITREX) 100 MG tablet  TAKE 1/2 TABLET AT ONSET OF MIGRAINE THEN TAKE 1 TABLET 2 HOURS LATER IF NEEDED MAX OF 2 TABLETS PER DAY   SYNTHROID 75 MCG tablet TAKE 1 TABLET EVERY DAY ON EMPTY STOMACHWITH A GLASS OF WATER AT LEAST 30-60 MINBEFORE BREAKFAST   zinc gluconate 50 MG tablet Take 50 mg by mouth daily.    Allergies: Patient is allergic to singulair [montelukast], penicillins, sulfa antibiotics, and sulfasalazine. Family History: Patient family history includes Alcohol abuse in her brother; Breast cancer (age of onset: 66) in her mother; Cancer in her mother; Heart disease (age of onset: 79) in her father; Hypertension in her brother and father; Osteoporosis in her mother; Stroke (age of onset: 48) in her mother. Social History:  Patient  reports that she has never smoked. She has never used smokeless tobacco. She reports that she does not drink alcohol and does not use drugs.  Review of Systems: Constitutional: Negative for fever malaise or anorexia Cardiovascular: negative for chest pain Respiratory: negative for SOB  Gastrointestinal: negative for abdominal pain  OBJECTIVE Vitals: Temp 98 F (36.7 C)  General: no acute distress , A&Ox3 Nasal congestion present, no respiratory distress.  Nonlabored breathing.  Willow Ora, MD

## 2021-10-12 ENCOUNTER — Ambulatory Visit: Payer: PRIVATE HEALTH INSURANCE | Admitting: Internal Medicine

## 2021-11-11 ENCOUNTER — Ambulatory Visit (INDEPENDENT_AMBULATORY_CARE_PROVIDER_SITE_OTHER): Payer: Medicare HMO | Admitting: Internal Medicine

## 2021-11-11 ENCOUNTER — Encounter: Payer: Self-pay | Admitting: Internal Medicine

## 2021-11-11 ENCOUNTER — Other Ambulatory Visit: Payer: Self-pay

## 2021-11-11 VITALS — BP 130/88 | HR 76 | Temp 97.6°F | Ht 62.0 in | Wt 117.8 lb

## 2021-11-11 DIAGNOSIS — Z1231 Encounter for screening mammogram for malignant neoplasm of breast: Secondary | ICD-10-CM | POA: Diagnosis not present

## 2021-11-11 DIAGNOSIS — E559 Vitamin D deficiency, unspecified: Secondary | ICD-10-CM

## 2021-11-11 DIAGNOSIS — Z23 Encounter for immunization: Secondary | ICD-10-CM | POA: Diagnosis not present

## 2021-11-11 DIAGNOSIS — Z780 Asymptomatic menopausal state: Secondary | ICD-10-CM | POA: Diagnosis not present

## 2021-11-11 DIAGNOSIS — E034 Atrophy of thyroid (acquired): Secondary | ICD-10-CM | POA: Diagnosis not present

## 2021-11-11 DIAGNOSIS — E785 Hyperlipidemia, unspecified: Secondary | ICD-10-CM | POA: Diagnosis not present

## 2021-11-11 DIAGNOSIS — R5383 Other fatigue: Secondary | ICD-10-CM | POA: Diagnosis not present

## 2021-11-11 DIAGNOSIS — R7301 Impaired fasting glucose: Secondary | ICD-10-CM

## 2021-11-11 DIAGNOSIS — Z0000 Encounter for general adult medical examination without abnormal findings: Secondary | ICD-10-CM | POA: Diagnosis not present

## 2021-11-11 LAB — COMPREHENSIVE METABOLIC PANEL
ALT: 14 U/L (ref 0–35)
AST: 20 U/L (ref 0–37)
Albumin: 4.7 g/dL (ref 3.5–5.2)
Alkaline Phosphatase: 59 U/L (ref 39–117)
BUN: 15 mg/dL (ref 6–23)
CO2: 31 mEq/L (ref 19–32)
Calcium: 9.5 mg/dL (ref 8.4–10.5)
Chloride: 100 mEq/L (ref 96–112)
Creatinine, Ser: 0.74 mg/dL (ref 0.40–1.20)
GFR: 85.14 mL/min (ref >60.00)
Glucose, Bld: 88 mg/dL (ref 70–99)
Potassium: 4 mEq/L (ref 3.5–5.1)
Sodium: 137 mEq/L (ref 135–145)
Total Bilirubin: 0.7 mg/dL (ref 0.2–1.2)
Total Protein: 6.7 g/dL (ref 6.0–8.3)

## 2021-11-11 LAB — TSH: TSH: 1.15 u[IU]/mL (ref 0.35–5.50)

## 2021-11-11 LAB — CBC WITH DIFFERENTIAL/PLATELET
Basophils Absolute: 0 10*3/uL (ref 0.0–0.1)
Basophils Relative: 1.1 % (ref 0.0–3.0)
Eosinophils Absolute: 0 10*3/uL (ref 0.0–0.7)
Eosinophils Relative: 1.4 % (ref 0.0–5.0)
HCT: 40.8 % (ref 36.0–46.0)
Hemoglobin: 14.1 g/dL (ref 12.0–15.0)
Lymphocytes Relative: 29.4 % (ref 12.0–46.0)
Lymphs Abs: 0.9 10*3/uL (ref 0.7–4.0)
MCHC: 34.5 g/dL (ref 30.0–36.0)
MCV: 97.9 fl (ref 78.0–100.0)
Monocytes Absolute: 0.3 10*3/uL (ref 0.1–1.0)
Monocytes Relative: 10.4 % (ref 3.0–12.0)
Neutro Abs: 1.8 10*3/uL (ref 1.4–7.7)
Neutrophils Relative %: 57.7 % (ref 43.0–77.0)
Platelets: 204 10*3/uL (ref 150.0–400.0)
RBC: 4.16 Mil/uL (ref 3.87–5.11)
RDW: 13.1 % (ref 11.5–15.5)
WBC: 3.2 10*3/uL — ABNORMAL LOW (ref 4.0–10.5)

## 2021-11-11 LAB — LIPID PANEL
Cholesterol: 183 mg/dL (ref 0–200)
HDL: 84.9 mg/dL (ref >39.00)
LDL Cholesterol: 91 mg/dL (ref 0–99)
NonHDL: 98.3
Total CHOL/HDL Ratio: 2
Triglycerides: 36 mg/dL (ref 0.0–149.0)
VLDL: 7.2 mg/dL (ref 0.0–40.0)

## 2021-11-11 LAB — VITAMIN D 25 HYDROXY (VIT D DEFICIENCY, FRACTURES): VITD: 56.21 ng/mL (ref 30.00–100.00)

## 2021-11-11 NOTE — Patient Instructions (Addendum)
Good to see you!   ? ?Please check your blood pressure a few times at home (at various times) and send me the readings  ? ?Your annual mammogram AND  DEXA  SCAN have been ordered.  Please call [facility] to call to make your appointments  .  The phone number for [facility] is  [phone]   ? ? ? ? ?

## 2021-11-11 NOTE — Assessment & Plan Note (Signed)

## 2021-11-11 NOTE — Progress Notes (Signed)
The patient is here for annual preventive examination and management of other chronic and acute problems. ? ?This visit occurred during the SARS-CoV-2 public health emergency.  Safety protocols were in place, including screening questions prior to the visit, additional usage of staff PPE, and extensive cleaning of exam room while observing appropriate contact time as indicated for disinfecting solutions.  ? ?  ?The risk factors are reflected in the social history. ?  ?The roster of all physicians providing medical care to patient - is listed in the Snapshot section of the chart. ?  ?Activities of daily living:  The patient is 100% independent in all ADLs: dressing, toileting, feeding as well as independent mobility ?  ?Home safety : The patient has smoke detectors in the home. They wear seatbelts.  There are no unsecured firearms at home. There is no violence in the home.  ?  ?There is no risks for hepatitis, STDs or HIV. There is no   history of blood transfusion. They have no travel history to infectious disease endemic areas of the world. ?  ?The patient has seen their dentist in the last six month. They have seen their eye doctor in the last year. The patinet  denies slight hearing difficulty with regard to whispered voices and some television programs.  They have deferred audiologic testing in the last year.  They do not  have excessive sun exposure. Discussed the need for sun protection: hats, long sleeves and use of sunscreen if there is significant sun exposure.  ?  ?Diet: the importance of a healthy diet is discussed. They do have a healthy diet. ?  ?The benefits of regular aerobic exercise were discussed. The patient  exercises  5 to 6 days per week  for  60 minutes.  ?  ?Depression screen: there are no signs or vegative symptoms of depression- irritability, change in appetite, anhedonia, sadness/tearfullness. ?  ?The following portions of the patient's history were reviewed and updated as appropriate:  allergies, current medications, past family history, past medical history,  past surgical history, past social history  and problem list. ?  ?Visual acuity was not assessed per patient preference since the patient has regular follow up with an  ophthalmologist. Hearing and body mass index were assessed and reviewed.  ?  ?During the course of the visit the patient was educated and counseled about appropriate screening and preventive services including : fall prevention , diabetes screening, nutrition counseling, colorectal cancer screening, and recommended immunizations.   ? ?Chief Complaint: ? 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   ?  ?2) ELEVATED BLOOD PRESSURE TODAY.  ? ?Review of Symptoms ? ?Patient denies headache, fevers, malaise, unintentional weight loss, skin rash, eye pain, sinus congestion and sinus pain, sore throat, dysphagia,  hemoptysis , cough, dyspnea, wheezing, chest pain, palpitations, orthopnea, edema, abdominal pain, nausea, melena, diarrhea, constipation, flank pain, dysuria, hematuria, urinary  Frequency, nocturia, numbness, tingling, seizures,  Focal weakness, Loss of consciousness,  Tremor, insomnia, depression, and suicidal ideation.   ? ?Physical Exam: ? ?BP 130/88 (BP Location: Left Arm, Patient Position: Sitting, Cuff Size: Normal)   Pulse 76   Temp 97.6 ?F (36.4 ?C) (Oral)   Ht 5\' 2"  (1.575 m)   Wt 117 lb 12.8 oz (53.4 kg)   SpO2 97%   BMI 21.55 kg/m?   ? ?General appearance: alert, cooperative and appears stated age ?Head: Normocephalic, without obvious abnormality,  atraumatic ?Eyes: conjunctivae/corneas clear. PERRL, EOM's intact. Fundi benign. ?Ears: normal TM's and external ear canals both ears ?Nose: Nares normal. Septum midline. Mucosa normal. No drainage or sinus tenderness. ?Throat: lips, mucosa, and tongue normal; teeth and gums normal ?Neck: no adenopathy, no carotid bruit, no  JVD, supple, symmetrical, trachea midline and thyroid not enlarged, symmetric, no tenderness/mass/nodules ?Lungs: clear to auscultation bilaterally ?Breasts: normal appearance, no masses or tenderness ?Heart: regular rate and rhythm, S1, S2 normal, no murmur, click, rub or gallop ?Abdomen: soft, non-tender; bowel sounds normal; no masses,  no organomegaly ?Extremities: extremities normal, atraumatic, no cyanosis or edema ?Pulses: 2+ and symmetric ?Skin: Skin color, texture, turgor normal. No rashes or lesions ?Neurologic: Alert and oriented X 3, normal strength and tone. Normal symmetric reflexes. Normal coordination and gait.    ? ? ?Assessment and Plan: ? ?Visit for preventive health examination ?age appropriate education and counseling updated, referrals for preventative services and immunizations addressed, dietary and smoking counseling addressed, most recent labs reviewed.  I have personally reviewed and have noted: ?  ?1) the patient's medical and social history ?2) The pt's use of alcohol, tobacco, and illicit drugs ?3) The patient's current medications and supplements ?4) Functional ability including ADL's, fall risk, home safety risk, hearing and visual impairment ?5) Diet and physical activities ?6) Evidence for depression or mood disorder ?7) The patient's height, weight, and BMI have been recorded in the chart ?  ?I have made referrals, and provided counseling and education based on review of the above ? ?Hypothyroidism ?Thyroid function is WNL on current dose.  No current changes needed.  ? ?Lab Results  ?Component Value Date  ? TSH 1.15 11/11/2021  ? ? ? ?Updated Medication List ?Outpatient Encounter Medications as of 11/11/2021  ?Medication Sig  ? Ascorbic Acid (VITAMIN C) 1000 MG tablet Take 1,000 mg by mouth daily.  ? Calcipotriene-Betameth Diprop 0.005-0.064 % FOAM Apply 1 application topically daily.  ? Cholecalciferol (VITAMIN D) 2000 UNITS CAPS Take one by mouth daily  ? diphenhydrAMINE  (BENADRYL) 25 MG tablet Take 25 mg by mouth at bedtime as needed.  ? loratadine (CLARITIN) 10 MG tablet Take 10 mg by mouth daily.  ? SUMAtriptan (IMITREX) 100 MG tablet TAKE 1/2 TABLET AT ONSET OF MIGRAINE THEN TAKE 1 TABLET 2 HOURS LATER IF NEEDED MAX OF 2 TABLETS PER DAY  ? SYNTHROID 75 MCG tablet TAKE 1 TABLET EVERY DAY ON EMPTY STOMACHWITH A GLASS OF WATER AT LEAST 30-60 MINBEFORE BREAKFAST  ? pseudoephedrine-guaifenesin (MUCINEX D) 60-600 MG 12 hr tablet  (Patient not taking: Reported on 11/10/2021)  ? [DISCONTINUED] azithromycin (ZITHROMAX) 250 MG tablet Take 2 tabs today, then 1 tab daily for 4 days (Patient not taking: Reported on 11/10/2021)  ? [DISCONTINUED] zinc gluconate 50 MG tablet Take 50 mg by mouth daily. (Patient not taking: Reported on 11/10/2021)  ? ?No facility-administered encounter medications on file as of 11/11/2021.  ? ? ?

## 2021-11-11 NOTE — Assessment & Plan Note (Signed)
Thyroid function is WNL on current dose.  No current changes needed.  ? ?Lab Results  ?Component Value Date  ? TSH 1.15 11/11/2021  ? ? ?

## 2021-12-01 ENCOUNTER — Other Ambulatory Visit: Payer: Self-pay | Admitting: Internal Medicine

## 2021-12-08 ENCOUNTER — Other Ambulatory Visit: Payer: Self-pay | Admitting: Internal Medicine

## 2021-12-31 ENCOUNTER — Ambulatory Visit
Admission: RE | Admit: 2021-12-31 | Discharge: 2021-12-31 | Disposition: A | Discharge status: Home / Self Care | Payer: Medicare HMO | Source: Ambulatory Visit | Attending: Internal Medicine | Admitting: Internal Medicine

## 2021-12-31 DIAGNOSIS — Z780 Asymptomatic menopausal state: Secondary | ICD-10-CM | POA: Diagnosis present

## 2021-12-31 DIAGNOSIS — Z1231 Encounter for screening mammogram for malignant neoplasm of breast: Secondary | ICD-10-CM | POA: Diagnosis present

## 2022-02-26 ENCOUNTER — Other Ambulatory Visit: Payer: Self-pay | Admitting: Internal Medicine

## 2022-02-26 DIAGNOSIS — G43809 Other migraine, not intractable, without status migrainosus: Secondary | ICD-10-CM

## 2022-05-08 ENCOUNTER — Other Ambulatory Visit: Payer: Self-pay | Admitting: Internal Medicine

## 2022-06-09 ENCOUNTER — Other Ambulatory Visit: Payer: Self-pay | Admitting: Family

## 2022-09-18 ENCOUNTER — Telehealth: Payer: Self-pay | Admitting: Internal Medicine

## 2022-09-18 DIAGNOSIS — G43809 Other migraine, not intractable, without status migrainosus: Secondary | ICD-10-CM

## 2022-09-20 MED ORDER — LEVOTHYROXINE SODIUM 75 MCG PO TABS
75.0000 ug | ORAL_TABLET | Freq: Every day | ORAL | 1 refills | Status: DC
Start: 2022-09-20 — End: 2022-10-19

## 2022-09-20 MED ORDER — SUMATRIPTAN SUCCINATE 100 MG PO TABS: ORAL_TABLET | ORAL | 0 refills | Status: DC

## 2022-09-20 NOTE — Telephone Encounter (Signed)
Pt need refill on sumatriptan and synthroid sent [facility]

## 2022-09-20 NOTE — Telephone Encounter (Signed)
Medication has been refilled.

## 2022-09-20 NOTE — Addendum Note (Signed)
Addended by: Adair Laundry on: 09/20/2022 12:35 PM   Modules accepted: Orders

## 2022-10-08 ENCOUNTER — Encounter: Payer: Self-pay | Admitting: Family

## 2022-10-08 ENCOUNTER — Telehealth (INDEPENDENT_AMBULATORY_CARE_PROVIDER_SITE_OTHER): Payer: Medicare HMO | Admitting: Family

## 2022-10-08 VITALS — Ht 62.0 in | Wt 117.8 lb

## 2022-10-08 DIAGNOSIS — J329 Chronic sinusitis, unspecified: Secondary | ICD-10-CM | POA: Insufficient documentation

## 2022-10-08 DIAGNOSIS — J0100 Acute maxillary sinusitis, unspecified: Secondary | ICD-10-CM | POA: Diagnosis not present

## 2022-10-08 MED ORDER — DOXYCYCLINE HYCLATE 100 MG PO TABS: 100.0000 mg | ORAL_TABLET | Freq: Two times a day (BID) | ORAL | 0 refills | Status: DC

## 2022-10-08 NOTE — Patient Instructions (Signed)
Start doxycycline for suspected bacterial sinusitis.  Ensure to take probiotics while on antibiotics and also for 2 weeks after completion. This can either be by eating yogurt daily or taking a probiotic supplement over the counter such as Culturelle.It is important to re-colonize the gut with good bacteria and also to prevent any diarrheal infections associated with antibiotic use.   Absolute pleasure seeing you today!  Sinus Infection, Adult A sinus infection, also called sinusitis, is inflammation of your sinuses. Sinuses are hollow spaces in the bones around your face. Your sinuses are located: Around your eyes. In the middle of your forehead. Behind your nose. In your cheekbones. Mucus normally drains out of your sinuses. When your nasal tissues become inflamed or swollen, mucus can become trapped or blocked. This allows bacteria, viruses, and fungi to grow, which leads to infection. Most infections of the sinuses are caused by a virus. A sinus infection can develop quickly. It can last for up to 4 weeks (acute) or for more than 12 weeks (chronic). A sinus infection often develops after a cold. What are the causes? This condition is caused by anything that creates swelling in the sinuses or stops mucus from draining. This includes: Allergies. Asthma. Infection from bacteria or viruses. Deformities or blockages in your nose or sinuses. Abnormal growths in the nose (nasal polyps). Pollutants, such as chemicals or irritants in the air. Infection from fungi. This is rare. What increases the risk? You are more likely to develop this condition if you: Have a weak body defense system (immune system). Do a lot of swimming or diving. Overuse nasal sprays. Smoke. What are the signs or symptoms? The main symptoms of this condition are pain and a feeling of pressure around the affected sinuses. Other symptoms include: Stuffy nose or congestion that makes it difficult to breathe through your  nose. Thick yellow or greenish drainage from your nose. Tenderness, swelling, and warmth over the affected sinuses. A cough that may get worse at night. Decreased sense of smell and taste. Extra mucus that collects in the throat or the back of the nose (postnasal drip) causing a sore throat or bad breath. Tiredness (fatigue). Fever. How is this diagnosed? This condition is diagnosed based on: Your symptoms. Your medical history. A physical exam. Tests to find out if your condition is acute or chronic. This may include: Checking your nose for nasal polyps. Viewing your sinuses using a device that has a light (endoscope). Testing for allergies or bacteria. Imaging tests, such as an MRI or CT scan. In rare cases, a bone biopsy may be done to rule out more serious types of fungal sinus disease. How is this treated? Treatment for a sinus infection depends on the cause and whether your condition is chronic or acute. If caused by a virus, your symptoms should go away on their own within 10 days. You may be given medicines to relieve symptoms. They include: Medicines that shrink swollen nasal passages (decongestants). A spray that eases inflammation of the nostrils (topical intranasal corticosteroids). Rinses that help get rid of thick mucus in your nose (nasal saline washes). Medicines that treat allergies (antihistamines). Over-the-counter pain relievers. If caused by bacteria, your health care provider may recommend waiting to see if your symptoms improve. Most bacterial infections will get better without antibiotic medicine. You may be given antibiotics if you have: A severe infection. A weak immune system. If caused by narrow nasal passages or nasal polyps, surgery may be needed. Follow these instructions at home:  Medicines Take, use, or apply over-the-counter and prescription medicines only as told by your health care provider. These may include nasal sprays. If you were prescribed an  antibiotic medicine, take it as told by your health care provider. Do not stop taking the antibiotic even if you start to feel better. Hydrate and humidify  Drink enough fluid to keep your urine pale yellow. Staying hydrated will help to thin your mucus. Use a cool mist humidifier to keep the humidity level in your home above 50%. Inhale steam for 10-15 minutes, 3-4 times a day, or as told by your health care provider. You can do this in the bathroom while a hot shower is running. Limit your exposure to cool or dry air. Rest Rest as much as possible. Sleep with your head raised (elevated). Make sure you get enough sleep each night. General instructions  Apply a warm, moist washcloth to your face 3-4 times a day or as told by your health care provider. This will help with discomfort. Use nasal saline washes as often as told by your health care provider. Wash your hands often with soap and water to reduce your exposure to germs. If soap and water are not available, use hand sanitizer. Do not smoke. Avoid being around people who are smoking (secondhand smoke). Keep all follow-up visits. This is important. Contact a health care provider if: You have a fever. Your symptoms get worse. Your symptoms do not improve within 10 days. Get help right away if: You have a severe headache. You have persistent vomiting. You have severe pain or swelling around your face or eyes. You have vision problems. You develop confusion. Your neck is stiff. You have trouble breathing. These symptoms may be an emergency. Get help right away. Call 911. Do not wait to see if the symptoms will go away. Do not drive yourself to the hospital. Summary A sinus infection is soreness and inflammation of your sinuses. Sinuses are hollow spaces in the bones around your face. This condition is caused by nasal tissues that become inflamed or swollen. The swelling traps or blocks the flow of mucus. This allows bacteria,  viruses, and fungi to grow, which leads to infection. If you were prescribed an antibiotic medicine, take it as told by your health care provider. Do not stop taking the antibiotic even if you start to feel better. Keep all follow-up visits. This is important. This information is not intended to replace advice given to you by your health care provider. Make sure you discuss any questions you have with your health care provider. Document Revised: 07/21/2021 Document Reviewed: 07/21/2021 Elsevier Patient Education  [place].

## 2022-10-08 NOTE — Progress Notes (Signed)
Virtual Visit via Video Note  I connected with Caitlyn Gregory on 10/08/22 at 11:00 AM EST by a video enabled telemedicine application and verified that I am speaking with the correct person using two identifiers. Location patient: home Location provider: work  Persons participating in the virtual visit: patient, provider  I discussed the limitations of evaluation and management by telemedicine and the availability of in person appointments. The patient expressed understanding and agreed to proceed.  HPI: Complains of sinus congestion x 2 weeks.  Left ear pressure.   She started with scratchy throat with clear congestion. She started to have head congestion with yellow and green. Endorses PND.She is taking mucinex-D, claritin.    No fever, sob, cough.    History of migraine, seasonal allergy  Never smoker  ROS: See pertinent positives and negatives per HPI.  EXAM:  VITALS per patient if applicable: Ht 5' 2"$  (1.575 m)   Wt 117 lb 12.8 oz (53.4 kg)   BMI 21.55 kg/m  BP Readings from Last 3 Encounters:  11/11/21 130/88  10/08/20 124/82  09/24/19 116/72   Wt Readings from Last 3 Encounters:  10/08/22 117 lb 12.8 oz (53.4 kg)  11/11/21 117 lb 12.8 oz (53.4 kg)  05/14/21 120 lb (54.4 kg)    GENERAL: alert, oriented, appears well and in no acute distress  HEENT: atraumatic, conjunttiva clear, no obvious abnormalities on inspection of external nose and ears  NECK: normal movements of the head and neck  LUNGS: on inspection no signs of respiratory distress, breathing rate appears normal, no obvious gross SOB, gasping or wheezing  CV: no obvious cyanosis  MS: moves all visible extremities without noticeable abnormality  PSYCH/NEURO: pleasant and cooperative, no obvious depression or anxiety, speech and thought processing grossly intact  ASSESSMENT AND PLAN: Acute non-recurrent maxillary sinusitis Assessment & Plan: Afebrile.  Duration 2 weeks.  Failed conservative therapy  with Mucinex, antihistamine.  Suspect bacterial sinusitis.  Start doxycycline.  Encouraged probiotics.  She will let me know how she is doing  Orders: -     Doxycycline Hyclate; Take 1 tablet (100 mg total) by mouth 2 (two) times daily.  Dispense: 14 tablet; Refill: 0     -we discussed possible serious and likely etiologies, options for evaluation and workup, limitations of telemedicine visit vs in person visit, treatment, treatment risks and precautions. Pt prefers to treat via telemedicine empirically rather then risking or undertaking an in person visit at this moment.    I discussed the assessment and treatment plan with the patient. The patient was provided an opportunity to ask questions and all were answered. The patient agreed with the plan and demonstrated an understanding of the instructions.   The patient was advised to call back or seek an in-person evaluation if the symptoms worsen or if the condition fails to improve as anticipated.  Advised if desired AVS can be mailed or viewed via [facility] if [place] user.   Mable Paris, FNP

## 2022-10-08 NOTE — Assessment & Plan Note (Signed)
Afebrile.  Duration 2 weeks.  Failed conservative therapy with Mucinex, antihistamine.  Suspect bacterial sinusitis.  Start doxycycline.  Encouraged probiotics.  She will let me know how she is doing

## 2022-10-19 ENCOUNTER — Other Ambulatory Visit: Payer: Self-pay | Admitting: Internal Medicine

## 2022-11-20 ENCOUNTER — Encounter: Payer: Self-pay | Admitting: Internal Medicine

## 2022-11-22 MED ORDER — LEVOTHYROXINE SODIUM 75 MCG PO TABS: ORAL_TABLET | ORAL | 0 refills | Status: DC

## 2022-12-01 ENCOUNTER — Encounter: Payer: Self-pay | Admitting: Internal Medicine

## 2022-12-01 ENCOUNTER — Ambulatory Visit (INDEPENDENT_AMBULATORY_CARE_PROVIDER_SITE_OTHER): Payer: Medicare HMO | Admitting: Internal Medicine

## 2022-12-01 VITALS — BP 128/80 | HR 84 | Temp 97.8°F | Ht 62.0 in | Wt 118.6 lb

## 2022-12-01 DIAGNOSIS — E034 Atrophy of thyroid (acquired): Secondary | ICD-10-CM

## 2022-12-01 DIAGNOSIS — R5383 Other fatigue: Secondary | ICD-10-CM | POA: Diagnosis not present

## 2022-12-01 DIAGNOSIS — Z0000 Encounter for general adult medical examination without abnormal findings: Secondary | ICD-10-CM

## 2022-12-01 DIAGNOSIS — Z23 Encounter for immunization: Secondary | ICD-10-CM

## 2022-12-01 DIAGNOSIS — R7301 Impaired fasting glucose: Secondary | ICD-10-CM | POA: Diagnosis not present

## 2022-12-01 DIAGNOSIS — Z1211 Encounter for screening for malignant neoplasm of colon: Secondary | ICD-10-CM

## 2022-12-01 DIAGNOSIS — Z1231 Encounter for screening mammogram for malignant neoplasm of breast: Secondary | ICD-10-CM

## 2022-12-01 DIAGNOSIS — G43809 Other migraine, not intractable, without status migrainosus: Secondary | ICD-10-CM

## 2022-12-01 DIAGNOSIS — Z8709 Personal history of other diseases of the respiratory system: Secondary | ICD-10-CM

## 2022-12-01 DIAGNOSIS — N952 Postmenopausal atrophic vaginitis: Secondary | ICD-10-CM

## 2022-12-01 DIAGNOSIS — E785 Hyperlipidemia, unspecified: Secondary | ICD-10-CM | POA: Diagnosis not present

## 2022-12-01 LAB — TSH: TSH: 1.13 u[IU]/mL (ref 0.35–5.50)

## 2022-12-01 LAB — CBC WITH DIFFERENTIAL/PLATELET
Basophils Absolute: 0 10*3/uL (ref 0.0–0.1)
Basophils Relative: 0.8 % (ref 0.0–3.0)
Eosinophils Absolute: 0.1 10*3/uL (ref 0.0–0.7)
Eosinophils Relative: 1.4 % (ref 0.0–5.0)
HCT: 41.1 % (ref 36.0–46.0)
Hemoglobin: 14.1 g/dL (ref 12.0–15.0)
Lymphocytes Relative: 22.9 % (ref 12.0–46.0)
Lymphs Abs: 0.9 10*3/uL (ref 0.7–4.0)
MCHC: 34.4 g/dL (ref 30.0–36.0)
MCV: 97.9 fl (ref 78.0–100.0)
Monocytes Absolute: 0.4 10*3/uL (ref 0.1–1.0)
Monocytes Relative: 9 % (ref 3.0–12.0)
Neutro Abs: 2.6 10*3/uL (ref 1.4–7.7)
Neutrophils Relative %: 65.9 % (ref 43.0–77.0)
Platelets: 215 10*3/uL (ref 150.0–400.0)
RBC: 4.2 Mil/uL (ref 3.87–5.11)
RDW: 12.6 % (ref 11.5–15.5)
WBC: 4 10*3/uL (ref 4.0–10.5)

## 2022-12-01 LAB — COMPREHENSIVE METABOLIC PANEL
ALT: 13 U/L (ref 0–35)
AST: 18 U/L (ref 0–37)
Albumin: 4.6 g/dL (ref 3.5–5.2)
Alkaline Phosphatase: 62 U/L (ref 39–117)
BUN: 17 mg/dL (ref 6–23)
CO2: 30 mEq/L (ref 19–32)
Calcium: 9.5 mg/dL (ref 8.4–10.5)
Chloride: 101 mEq/L (ref 96–112)
Creatinine, Ser: 0.72 mg/dL (ref 0.40–1.20)
GFR: 87.34 mL/min (ref >60.00)
Glucose, Bld: 82 mg/dL (ref 70–99)
Potassium: 4.3 mEq/L (ref 3.5–5.1)
Sodium: 135 mEq/L (ref 135–145)
Total Bilirubin: 0.6 mg/dL (ref 0.2–1.2)
Total Protein: 6.7 g/dL (ref 6.0–8.3)

## 2022-12-01 LAB — LIPID PANEL
Cholesterol: 196 mg/dL (ref 0–200)
HDL: 83.3 mg/dL (ref >39.00)
LDL Cholesterol: 104 mg/dL — ABNORMAL HIGH (ref 0–99)
NonHDL: 113.19
Total CHOL/HDL Ratio: 2
Triglycerides: 47 mg/dL (ref 0.0–149.0)
VLDL: 9.4 mg/dL (ref 0.0–40.0)

## 2022-12-01 LAB — LDL CHOLESTEROL, DIRECT: Direct LDL: 100 mg/dL

## 2022-12-01 MED ORDER — ESTRADIOL 0.1 MG/GM VA CREA
1.0000 | TOPICAL_CREAM | Freq: Every day | VAGINAL | 12 refills | Status: DC
Start: 2022-12-01 — End: 2023-12-06

## 2022-12-01 MED ORDER — SUMATRIPTAN SUCCINATE 100 MG PO TABS: ORAL_TABLET | ORAL | 5 refills | Status: DC

## 2022-12-01 NOTE — Progress Notes (Unsigned)
Patient ID: Caitlyn Gregory, female    DOB: 1956/03/23  Age: 67 y.o. MRN: [id number]  The patient is here for annual preventive examination and management of other chronic and acute problems.   The risk factors are reflected in the social history.  The roster of all physicians providing medical care to patient - is listed in the Snapshot section of the chart.  Activities of daily living:  The patient is 100% independent in all ADLs: dressing, toileting, feeding as well as independent mobility  Home safety : The patient has smoke detectors in the home. They wear seatbelts.  There are no firearms at home. There is no violence in the home.   There is no risks for hepatitis, STDs or HIV. There is no   history of blood transfusion. They have no travel history to infectious disease endemic areas of the world.  The patient has seen their dentist in the last six month. They have seen their eye doctor in the last year. They admit to slight hearing difficulty with regard to whispered voices and some television programs.  They have deferred audiologic testing in the last year.  They do not  have excessive sun exposure. Discussed the need for sun protection: hats, long sleeves and use of sunscreen if there is significant sun exposure.   Diet: the importance of a healthy diet is discussed. They do have a healthy diet.  The benefits of regular aerobic exercise were discussed. She walks 4 times per week ,  20 minutes.   Depression screen: there are no signs or vegative symptoms of depression- irritability, change in appetite, anhedonia, sadness/tearfullness.  Cognitive assessment: the patient manages all their financial and personal affairs and is actively engaged. They could relate day,date,year and events; recalled 2/3 objects at 3 minutes; performed clock-face test normally.  The following portions of the patient's history were reviewed and updated as appropriate: allergies, current medications, past family  history, past medical history,  past surgical history, past social history  and problem list.  Visual acuity was not assessed per patient preference since she has regular follow up with her ophthalmologist. Hearing and body mass index were assessed and reviewed.   During the course of the visit the patient was educated and counseled about appropriate screening and preventive services including : fall prevention , diabetes screening, nutrition counseling, colorectal cancer screening, and recommended immunizations.    CC: The primary encounter diagnosis was Colon cancer screening. Diagnoses of Other migraine without status migrainosus, not intractable, Hypothyroidism due to acquired atrophy of thyroid, Fatigue, unspecified type, Hyperlipidemia, unspecified hyperlipidemia type, Impaired fasting glucose, and Encounter for screening mammogram for malignant neoplasm of breast were also pertinent to this visit.  History Martesha has a past medical history of Allergy, Anemia, Asthma, Fibrocystic breast disease, History of migraine headaches, Hyperlipidemia, Thyroid disease, and Vitamin D deficiency.   She has a past surgical history that includes chin implant (1980s); Appendectomy (1967); Frontalis suspension (2008); Hernia repair (2008); and Breast cyst aspiration (Left, 2009).   Her family history includes Alcohol abuse in her brother; Breast cancer (age of onset: 51) in her mother; Cancer in her mother; Heart disease (age of onset: 61) in her father; Hypertension in her brother and father; Osteoporosis in her mother; Stroke (age of onset: 63) in her mother.She reports that she has never smoked. She has never used smokeless tobacco. She reports that she does not drink alcohol and does not use drugs.  Outpatient Medications Prior to Visit  Medication  Sig Dispense Refill  . Calcipotriene-Betameth Diprop 0.005-0.064 % FOAM Apply 1 application topically daily.    . Cholecalciferol (VITAMIN D) 2000 UNITS CAPS Take  one by mouth daily    . diphenhydrAMINE (BENADRYL) 25 MG tablet Take 25 mg by mouth at bedtime as needed.    Marland Kitchen levothyroxine (SYNTHROID) 75 MCG tablet TAKE ONE TABLET BY MOUTH DAILY BEFORE BREAKFAST 30 tablet 0  . loratadine (CLARITIN) 10 MG tablet Take 10 mg by mouth daily.    . pseudoephedrine-guaifenesin (MUCINEX D) 60-600 MG 12 hr tablet     . SUMAtriptan (IMITREX) 100 MG tablet May repeat in 2 hours if headache persists or recurs. 10 tablet 0  . Ascorbic Acid (VITAMIN C) 1000 MG tablet Take 1,000 mg by mouth daily. (Patient not taking: Reported on 12/01/2022)    . doxycycline (VIBRA-TABS) 100 MG tablet Take 1 tablet (100 mg total) by mouth 2 (two) times daily. (Patient not taking: Reported on 12/01/2022) 14 tablet 0   No facility-administered medications prior to visit.    Review of Systems  Patient denies headache, fevers, malaise, unintentional weight loss, skin rash, eye pain, sinus congestion and sinus pain, sore throat, dysphagia,  hemoptysis , cough, dyspnea, wheezing, chest pain, palpitations, orthopnea, edema, abdominal pain, nausea, melena, diarrhea, constipation, flank pain, dysuria, hematuria, urinary  Frequency, nocturia, numbness, tingling, seizures,  Focal weakness, Loss of consciousness,  Tremor, insomnia, depression, anxiety, and suicidal ideation.     Objective:  BP 128/80   Pulse 84   Temp 97.8 F (36.6 C) (Oral)   Ht 5\' 2"  (1.575 m)   Wt 118 lb 9.6 oz (53.8 kg)   SpO2 97%   BMI 21.69 kg/m   Physical Exam Vitals reviewed.  Constitutional:      General: She is not in acute distress.    Appearance: Normal appearance. She is well-developed and normal weight. She is not ill-appearing, toxic-appearing or diaphoretic.  HENT:     Head: Normocephalic.     Right Ear: Tympanic membrane, ear canal and external ear normal. There is no impacted cerumen.     Left Ear: Tympanic membrane, ear canal and external ear normal. There is no impacted cerumen.     Nose: Nose normal.      Mouth/Throat:     Mouth: Mucous membranes are moist.     Pharynx: Oropharynx is clear.  Eyes:     General: No scleral icterus.       Right eye: No discharge.        Left eye: No discharge.     Conjunctiva/sclera: Conjunctivae normal.     Pupils: Pupils are equal, round, and reactive to light.  Neck:     Thyroid: No thyromegaly.     Vascular: No carotid bruit or JVD.  Cardiovascular:     Rate and Rhythm: Normal rate and regular rhythm.     Heart sounds: Normal heart sounds.  Pulmonary:     Effort: Pulmonary effort is normal. No respiratory distress.     Breath sounds: Normal breath sounds.  Chest:  Breasts:    Breasts are symmetrical.     Right: Normal. No swelling, inverted nipple, mass, nipple discharge, skin change or tenderness.     Left: Normal. No swelling, inverted nipple, mass, nipple discharge, skin change or tenderness.  Abdominal:     General: Bowel sounds are normal.     Palpations: Abdomen is soft. There is no mass.     Tenderness: There is no abdominal tenderness. There is  no guarding or rebound.  Musculoskeletal:        General: Normal range of motion.     Cervical back: Normal range of motion and neck supple.  Lymphadenopathy:     Cervical: No cervical adenopathy.     Upper Body:     Right upper body: No supraclavicular, axillary or pectoral adenopathy.     Left upper body: No supraclavicular, axillary or pectoral adenopathy.  Skin:    General: Skin is warm and dry.  Neurological:     General: No focal deficit present.     Mental Status: She is alert and oriented to person, place, and time. Mental status is at baseline.  Psychiatric:        Mood and Affect: Mood normal.        Behavior: Behavior normal.        Thought Content: Thought content normal.        Judgment: Judgment normal.   Assessment & Plan:  Colon cancer screening  Other migraine without status migrainosus, not intractable  Hypothyroidism due to acquired atrophy of  thyroid  Fatigue, unspecified type  Hyperlipidemia, unspecified hyperlipidemia type  Impaired fasting glucose  Encounter for screening mammogram for malignant neoplasm of breast      I provided 40 minutes of  face-to-face time during this encounter reviewing patient's current problems and past surgeries,  recent labs and imaging studies, providing counseling on the above mentioned problems , and coordination  of care .   Follow-up: No follow-ups on file.   Crecencio Mc, MD

## 2022-12-01 NOTE — Assessment & Plan Note (Signed)

## 2022-12-01 NOTE — Patient Instructions (Addendum)
I have prescribed Estrace cream for the vaginal dryness and fragility. Insert 1 gram intravaginally at bedtime daily for 2 weeks,  Then reduce use to twice weekly thereafter   I will refill Synthroid once I see your TSH from today  Your annual mammogram has been ordered AND IS DUE in  May.  [facility] will not allow [place] to schedule it for you,  so please  call to make your appointment [phone]     I will initiate the order for your colon cancer screening  Test, the one called  Cologuard.  It will be delivered to your house, and you will send off a stool sample in the envelope it provides.

## 2022-12-02 MED ORDER — LEVOTHYROXINE SODIUM 75 MCG PO TABS: ORAL_TABLET | ORAL | 1 refills | Status: DC

## 2022-12-02 NOTE — Telephone Encounter (Signed)
[facility] called stating the count is not clear on medication. [facility] wants to know is it 90 or 30 pills

## 2022-12-02 NOTE — Assessment & Plan Note (Signed)
Recommend trial of Estrace cream

## 2022-12-02 NOTE — Assessment & Plan Note (Signed)
Thyroid function is WNL on current dose.  No current changes needed.   Lab Results  Component Value Date   TSH 1.13 12/01/2022

## 2022-12-03 MED ORDER — LEVOTHYROXINE SODIUM 75 MCG PO TABS: ORAL_TABLET | ORAL | 1 refills | Status: DC

## 2022-12-03 NOTE — Addendum Note (Signed)
Addended by: Sherlene Shams on: 12/03/2022 05:14 AM   Modules accepted: Orders

## 2022-12-08 LAB — RSV(RESPIRATORY SYNCYTIAL VIRUS) AB, BLOOD: RSV Antibodies: 1:32 {titer} — ABNORMAL HIGH

## 2022-12-24 LAB — COLOGUARD: COLOGUARD: NEGATIVE

## 2022-12-27 ENCOUNTER — Telehealth: Payer: Self-pay | Admitting: Internal Medicine

## 2022-12-27 NOTE — Telephone Encounter (Signed)
Contacted [facility]ree to schedule their annual wellness visit. Appointment made for 01/12/2023.  Verlee Rossetti; Care Guide Ambulatory Clinical Support [place] [facility] Direct Dial: [phone]

## 2022-12-29 ENCOUNTER — Ambulatory Visit
Admission: RE | Admit: 2022-12-29 | Discharge: 2022-12-29 | Disposition: A | Discharge status: Home / Self Care | Payer: Medicare HMO | Source: Ambulatory Visit | Attending: Internal Medicine | Admitting: Internal Medicine

## 2022-12-29 DIAGNOSIS — Z1231 Encounter for screening mammogram for malignant neoplasm of breast: Secondary | ICD-10-CM | POA: Diagnosis present

## 2023-01-12 ENCOUNTER — Ambulatory Visit (INDEPENDENT_AMBULATORY_CARE_PROVIDER_SITE_OTHER): Payer: Medicare HMO

## 2023-01-12 DIAGNOSIS — Z0000 Encounter for general adult medical examination without abnormal findings: Secondary | ICD-10-CM

## 2023-01-12 NOTE — Progress Notes (Cosign Needed Addendum)
I connected with  Caitlyn Gregory on 01/12/23 by a audio enabled telemedicine application and verified that I am speaking with the correct person using two identifiers.  Patient Location: Home  Provider Location: Home Office  I discussed the limitations of evaluation and management by telemedicine. The patient expressed understanding and agreed to proceed.   Subjective:   Caitlyn Gregory is a 67 y.o. female who presents for Medicare Annual (Subsequent) preventive examination.  Review of Systems    Per HPI unless specifically indicated below.        Objective:       12/01/2022    9:43 AM 10/08/2022   11:10 AM 11/11/2021    8:38 AM  Vitals with BMI  Height 5\' 2"  5\' 2"  5\' 2"   Weight 118 lbs 10 oz 117 lbs 13 oz 117 lbs 13 oz  BMI 21.69 21.54 21.54  Systolic 128  130  Diastolic 80  88  Pulse 84  76    There were no vitals filed for this visit. There is no height or weight on file to calculate BMI.     01/12/2023    9:14 AM  Advanced Directives  Does Patient Have a Medical Advance Directive? Yes  Type of Estate agent of [place];Living will  Does patient want to make changes to medical advance directive? No - Patient declined    Current Medications (verified) Outpatient Encounter Medications as of 01/12/2023  Medication Sig   Calcipotriene-Betameth Diprop 0.005-0.064 % FOAM Apply 1 application topically daily.   Cholecalciferol (VITAMIN D) 2000 UNITS CAPS Take one by mouth daily   diphenhydrAMINE (BENADRYL) 25 MG tablet Take 25 mg by mouth at bedtime as needed.   ibuprofen (ADVIL) 200 MG tablet Take 200 mg by mouth every 6 (six) hours as needed.   levothyroxine (SYNTHROID) 75 MCG tablet TAKE ONE TABLET BY MOUTH DAILY BEFORE BREAKFAST   loratadine (CLARITIN) 10 MG tablet Take 10 mg by mouth daily.   pseudoephedrine-guaifenesin (MUCINEX D) 60-600 MG 12 hr tablet    SUMAtriptan (IMITREX) 100 MG tablet May repeat in 2 hours if headache persists or recurs.    estradiol (ESTRACE VAGINAL) 0.1 MG/GM vaginal cream Place 1 Applicatorful vaginally at bedtime. For 2 weeks,  then twce weekly thereafter (Patient not taking: Reported on 01/12/2023)   No facility-administered encounter medications on file as of 01/12/2023.    Allergies (verified) Singulair [montelukast], Penicillins, Sulfa antibiotics, and Sulfasalazine   History: Past Medical History:  Diagnosis Date   Allergy    Anemia    Asthma    Fibrocystic breast disease    History of migraine headaches    Hyperlipidemia    Thyroid disease    Vitamin D deficiency    Past Surgical History:  Procedure Laterality Date   APPENDECTOMY  1967   secondary to rupture   BREAST CYST ASPIRATION Left 2009   neg   chin implant  1980s   FRONTALIS SUSPENSION  2008   HERNIA REPAIR  2008   Family History  Problem Relation Age of Onset   Stroke Mother 26   Cancer Mother        unknown tyoe   Osteoporosis Mother    Breast cancer Mother 56   Heart disease Father 80       AMI, died at 96 from second MI   Hypertension Father    Alcohol abuse Brother    Hypertension Brother    Social History   Socioeconomic History  Marital status: Married    Spouse name: Not on file   Number of children: 3   Years of education: Not on file   Highest education level: Not on file  Occupational History   Occupation: Retired  Tobacco Use   Smoking status: Never   Smokeless tobacco: Never  Vaping Use   Vaping Use: Never used  Substance and Sexual Activity   Alcohol use: No   Drug use: No   Sexual activity: Not on file  Other Topics Concern   Not on file  Social History Narrative   Not on file   Social Determinants of Health   Financial Resource Strain: Low Risk  (01/12/2023)   Overall Financial Resource Strain (CARDIA)    Difficulty of Paying Living Expenses: Not hard at all  Food Insecurity: No Food Insecurity (01/12/2023)   Hunger Vital Sign    Worried About Running Out of Food in the Last Year:  Never true    Ran Out of Food in the Last Year: Never true  Transportation Needs: No Transportation Needs (01/12/2023)   PRAPARE - Administrator, Civil Service (Medical): No    Lack of Transportation (Non-Medical): No  Physical Activity: Sufficiently Active (01/12/2023)   Exercise Vital Sign    Days of Exercise per Week: 7 days    Minutes of Exercise per Session: 60 min  Stress: No Stress Concern Present (01/12/2023)   [employer] of Occupational Health - Occupational Stress Questionnaire    Feeling of Stress : Not at all  Social Connections: Socially Integrated (01/12/2023)   Social Connection and Isolation Panel [NHANES]    Frequency of Communication with Friends and Family: More than three times a week    Frequency of Social Gatherings with Friends and Family: More than three times a week    Attends Religious Services: More than 4 times per year    Active Member of [employer] or Organizations: Yes    Attends Banker Meetings: Never    Marital Status: Married    Tobacco Counseling Counseling given: No   Clinical Intake:  Pre-visit preparation completed: No  Pain : No/denies pain     Nutritional Status: BMI of 19-24  Normal Nutritional Risks: None Diabetes: No  How often do you need to have someone help you when you read instructions, pamphlets, or other written materials from your doctor or pharmacy?: 1 - Never  Diabetic?No  Interpreter Needed?: No  Information entered by :: Laurel Dimmer, CMA   Activities of Daily Living    01/12/2023    9:03 AM  In your present state of health, do you have any difficulty performing the following activities:  Hearing? 0  Vision? 0  Difficulty concentrating or making decisions? 0  Walking or climbing stairs? 0  Dressing or bathing? 0  Doing errands, shopping? 0    Patient Care Team: Sherlene Shams, MD as PCP - General (Internal Medicine)  Indicate any recent Medical Services you may have  received from other than [facility] providers in the past year (date may be approximate).     Assessment:   This is a routine wellness examination for Caitlyn Gregory.   Hearing/Vision screen Denies any hearing issues. Denies any change to her vision. Wear glasses. Annual Eye Exam. [facility]   Dietary issues and exercise activities discussed: Current Exercise Habits: Structured exercise class, Type of exercise: walking, Time (Minutes): 60, Frequency (Times/Week): 7, Weekly Exercise (Minutes/Week): 420, Intensity: Moderate, Exercise limited by: None identified  Goals Addressed   None    Depression Screen    01/12/2023    9:02 AM 12/01/2022    9:46 AM 10/08/2022   11:08 AM 11/10/2021    4:38 PM 05/14/2021   10:22 AM 10/08/2020    8:24 AM 05/22/2020   11:27 AM  PHQ 2/9 Scores  PHQ - 2 Score 0 0 0 0 0 0 2  PHQ- 9 Score       3    Fall Risk    01/12/2023    9:03 AM 12/01/2022    9:46 AM 10/08/2022   11:08 AM 11/10/2021    4:37 PM 05/14/2021   10:22 AM  Fall Risk   Falls in the past year? 0 0 0 0 0  Number falls in past yr: 0 0 0    Injury with Fall? 0 0 0    Risk for fall due to : No Fall Risks No Fall Risks No Fall Risks No Fall Risks   Follow up Falls evaluation completed Falls evaluation completed Falls evaluation completed Falls evaluation completed Falls evaluation completed    FALL RISK PREVENTION PERTAINING TO THE HOME:  Any stairs in or around the home? Yes If so, are there any without handrails? No  Home free of loose throw rugs in walkways, pet beds, electrical cords, etc? Yes  Adequate lighting in your home to reduce risk of falls? Yes   ASSISTIVE DEVICES UTILIZED TO PREVENT FALLS:  Life alert? No  Use of a cane, walker or w/c? No Grab bars in the bathroom? No  Shower chair or bench in shower? No  Elevated toilet seat or a handicapped toilet? No   TIMED UP AND GO:  Was the test performed?Unable to perform, virtual appointment   Cognitive Function:        01/12/2023     9:06 AM  6CIT Screen  What Year? 0 points  What month? 0 points  What time? 0 points  Count back from 20 0 points  Months in reverse 0 points  Repeat phrase 2 points  Total Score 2 points    Immunizations Immunization History  Administered Date(s) Administered   Influenza Split 05/31/2012, 07/10/2015   Influenza,inj,Quad PF,6+ Mos 07/04/2013, 07/09/2014, 07/06/2019   Influenza-Unspecified 06/30/2016, 06/12/2017, 06/03/2018, 07/06/2019, 06/27/2020, 06/13/2021   PFIZER(Purple Top)SARS-COV-2 Vaccination 11/29/2019, 12/21/2019, 08/18/2020   PNEUMOCOCCAL CONJUGATE-20 12/01/2022   Td 11/11/2021   Tdap 07/04/2010   Zoster Recombinat (Shingrix) 05/14/2019, 09/21/2019    TDAP status: Up to date  Flu Vaccine status: Up to date  Pneumococcal vaccine status: Up to date  Covid-19 vaccine status: Completed vaccines  Qualifies for Shingles Vaccine? Yes   Zostavax completed Yes   Shingrix Completed?: Yes  Screening Tests Health Maintenance  Topic Date Due   COVID-19 Vaccine (4 - 2023-24 season) 04/30/2022   INFLUENZA VACCINE  03/31/2023   MAMMOGRAM  12/29/2023   Medicare Annual Wellness (AWV)  01/12/2024   Fecal DNA (Cologuard)  12/13/2025   DTaP/Tdap/Td (3 - Td or Tdap) 11/12/2031   Pneumonia Vaccine 35+ Years old  Completed   DEXA SCAN  Completed   Hepatitis C Screening  Completed   Zoster Vaccines- Shingrix  Completed   HPV VACCINES  Aged Out    Health Maintenance  Health Maintenance Due  Topic Date Due   COVID-19 Vaccine (4 - 2023-24 season) 04/30/2022    Colorectal cancer screening: Type of screening: Cologuard. Completed 12/14/2022. Repeat every 3 years  Mammogram status: Completed 12/29/2022. Repeat every year  DEXA Scan: completed 12/31/2021  Lung Cancer Screening: (Low Dose CT Chest recommended if Age 25-80 years, 30 pack-year currently smoking OR have quit w/in 15years.) does not qualify.   Lung Cancer Screening Referral: not applicable   Additional  Screening:  Hepatitis C Screening: does qualify; Completed 08/11/2017  Vision Screening: Recommended annual ophthalmology exams for early detection of glaucoma and other disorders of the eye. Is the patient up to date with their annual eye exam?  Yes  Who is the provider or what is the name of the office in which the patient attends annual eye exams? [facility]  If pt is not established with a provider, would they like to be referred to a provider to establish care? No .   Dental Screening: Recommended annual dental exams for proper oral hygiene  Community Resource Referral / Chronic Care Management: CRR required this visit?  No   CCM required this visit?  No      Plan:     I have personally reviewed and noted the following in the patient's chart:   Medical and social history Use of alcohol, tobacco or illicit drugs  Current medications and supplements including opioid prescriptions. Patient is not currently taking opioid prescriptions. Functional ability and status Nutritional status Physical activity Advanced directives List of other physicians Hospitalizations, surgeries, and ER visits in previous 12 months Vitals Screenings to include cognitive, depression, and falls Referrals and appointments  In addition, I have reviewed and discussed with patient certain preventive protocols, quality metrics, and best practice recommendations. A written personalized care plan for preventive services as well as general preventive health recommendations were provided to patient.    Caitlyn Gregory , Thank you for taking time to come for your Medicare Wellness Visit. I appreciate your ongoing commitment to your health goals. Please review the following plan we discussed and let me know if I can assist you in the future.   These are the goals we discussed:  Goals   None     This is a list of the screening recommended for you and due dates:  Health Maintenance  Topic Date Due    COVID-19 Vaccine (4 - 2023-24 season) 04/30/2022   Flu Shot  03/31/2023   Mammogram  12/29/2023   Medicare Annual Wellness Visit  01/12/2024   Cologuard (Stool DNA test)  12/13/2025   DTaP/Tdap/Td vaccine (3 - Td or Tdap) 11/12/2031   Pneumonia Vaccine  Completed   DEXA scan (bone density measurement)  Completed   Hepatitis C Screening: USPSTF Recommendation to screen - Ages 23-79 yo.  Completed   Zoster (Shingles) Vaccine  Completed   HPV Vaccine  Aged [address] [place]   01/12/2023   Nurse Notes: Approximately 30 minute Non-Face -To-Face Medicare Wellness Visit       I have reviewed the above information and agree with above.   Duncan Dull, MD

## 2023-01-12 NOTE — Patient Instructions (Signed)

## 2023-01-17 ENCOUNTER — Encounter: Payer: Self-pay | Admitting: Internal Medicine

## 2023-01-18 MED ORDER — LEVOTHYROXINE SODIUM 75 MCG PO TABS: ORAL_TABLET | ORAL | 1 refills | Status: DC

## 2023-03-14 DIAGNOSIS — M792 Neuralgia and neuritis, unspecified: Secondary | ICD-10-CM | POA: Diagnosis not present

## 2023-03-14 DIAGNOSIS — J3489 Other specified disorders of nose and nasal sinuses: Secondary | ICD-10-CM | POA: Diagnosis not present

## 2023-03-14 DIAGNOSIS — J309 Allergic rhinitis, unspecified: Secondary | ICD-10-CM | POA: Diagnosis not present

## 2023-03-24 ENCOUNTER — Ambulatory Visit: Payer: Medicare HMO | Admitting: Dermatology

## 2023-03-24 VITALS — BP 145/87 | HR 81

## 2023-03-24 DIAGNOSIS — L814 Other melanin hyperpigmentation: Secondary | ICD-10-CM

## 2023-03-24 DIAGNOSIS — L820 Inflamed seborrheic keratosis: Secondary | ICD-10-CM | POA: Diagnosis not present

## 2023-03-24 DIAGNOSIS — L409 Psoriasis, unspecified: Secondary | ICD-10-CM | POA: Diagnosis not present

## 2023-03-24 DIAGNOSIS — L821 Other seborrheic keratosis: Secondary | ICD-10-CM | POA: Diagnosis not present

## 2023-03-24 DIAGNOSIS — D1801 Hemangioma of skin and subcutaneous tissue: Secondary | ICD-10-CM | POA: Diagnosis not present

## 2023-03-24 DIAGNOSIS — D229 Melanocytic nevi, unspecified: Secondary | ICD-10-CM

## 2023-03-24 DIAGNOSIS — W908XXA Exposure to other nonionizing radiation, initial encounter: Secondary | ICD-10-CM | POA: Diagnosis not present

## 2023-03-24 DIAGNOSIS — L03012 Cellulitis of left finger: Secondary | ICD-10-CM

## 2023-03-24 DIAGNOSIS — L578 Other skin changes due to chronic exposure to nonionizing radiation: Secondary | ICD-10-CM | POA: Diagnosis not present

## 2023-03-24 DIAGNOSIS — Z1283 Encounter for screening for malignant neoplasm of skin: Secondary | ICD-10-CM

## 2023-03-24 DIAGNOSIS — L988 Other specified disorders of the skin and subcutaneous tissue: Secondary | ICD-10-CM

## 2023-03-24 DIAGNOSIS — Z79899 Other long term (current) drug therapy: Secondary | ICD-10-CM

## 2023-03-24 DIAGNOSIS — Z7189 Other specified counseling: Secondary | ICD-10-CM

## 2023-03-24 MED ORDER — ZORYVE 0.3 % EX CREA: TOPICAL_CREAM | CUTANEOUS | 3 refills | Status: DC

## 2023-03-24 NOTE — Progress Notes (Signed)
Follow-Up Visit   Subjective  Caitlyn Gregory is a 67 y.o. female who presents for the following: Skin Cancer Screening and Full Body Skin Exam The patient presents for Total-Body Skin Exam (TBSE) for skin cancer screening and mole check. The patient has spots, moles and lesions to be evaluated, some may be new or changing and the patient may have concern these could be cancer.  The following portions of the chart were reviewed this encounter and updated as appropriate: medications, allergies, medical history  Review of Systems:  No other skin or systemic complaints except as noted in HPI or Assessment and Plan.  Objective  Well appearing patient in no apparent distress; mood and affect are within normal limits.  A full examination was performed including scalp, head, eyes, ears, nose, lips, neck, chest, axillae, abdomen, back, buttocks, bilateral upper extremities, bilateral lower extremities, hands, feet, fingers, toes, fingernails, and toenails. All findings within normal limits unless otherwise noted below.   Relevant physical exam findings are noted in the Assessment and Plan.  L ring finger Erythema.   L post thigh x 1, L elbow x 1, L leg x 1 (3) Erythematous stuck-on, waxy papule or plaque   Assessment & Plan   SKIN CANCER SCREENING PERFORMED TODAY.  ACTINIC DAMAGE - Chronic condition, secondary to cumulative UV/sun exposure - diffuse scaly erythematous macules with underlying dyspigmentation - Recommend daily broad spectrum sunscreen SPF 30+ to sun-exposed areas, reapply every 2 hours as needed.  - Staying in the shade or wearing long sleeves, sun glasses (UVA+UVB protection) and wide brim hats (4-inch brim around the entire circumference of the hat) are also recommended for sun protection.  - Call for new or changing lesions.  LENTIGINES, SEBORRHEIC KERATOSES, HEMANGIOMAS - Benign normal skin lesions - Benign-appearing - Call for any changes  MELANOCYTIC NEVI -  Tan-brown and/or pink-flesh-colored symmetric macules and papules - Benign appearing on exam today - Observation - Call clinic for new or changing moles - Recommend daily use of broad spectrum spf 30+ sunscreen to sun-exposed areas.   PSORIASIS Exam: Scalp clear, pinkness on the elbows, but no scale. 3% BSA. Chronic condition with duration or expected duration over one year. Currently well-controlled. Patient denies joint pain  Psoriasis is a chronic non-curable, but treatable genetic/hereditary disease that may have other systemic features affecting other organ systems such as joints (Psoriatic Arthritis). It is associated with an increased risk of inflammatory bowel disease, heart disease, non-alcoholic fatty liver disease, and depression.  Treatments include light and laser treatments; topical medications; and systemic medications including oral and injectables.  Treatment Plan: Continue OTC Gold Bond psoriasis cream.  Start Zoryve cream to aa's QD PRN. If not covered by insurance or very expensive consider Vtama cream QD. If both are too expensive will go back to Enstilar foam.   Paronychia of finger, left Acute problem now resolved but some remaining inflammation L ring finger  Previously treated with Doxycycline 100 mg po BID x 7 days and Mupirocin 2% ointment one month ago, but patient continues to have issues with area. Acute issue seems resolved today and oral antibiotic no longer needed.  Continue Mupirocin 2% ointment to aa QHS x 2 weeks (pt has already).  Inflamed seborrheic keratosis (3) L post thigh x 1, L elbow x 1, L leg x 1 Symptomatic, irritating, patient would like treated.  Destruction of lesion - L post thigh x 1, L elbow x 1, L leg x 1 (3) Complexity: simple  Destruction method: cryotherapy   Informed consent: discussed and consent obtained   Timeout:  patient name, date of birth, surgical site, and procedure verified Lesion destroyed using liquid nitrogen:  Yes   Region frozen until ice ball extended beyond lesion: Yes   Outcome: patient tolerated procedure well with no complications   Post-procedure details: wound care instructions given    Vascular birth mark - sacral area  Benign-appearing.  Observation.  Call clinic for new or changing lesions.  Recommend daily use of broad spectrum spf 30+ sunscreen to sun-exposed areas.    FACIAL ELASTOSIS Exam: Rhytides and volume loss. Treatment Plan: Recommend Alastin Restorative Skin Complex daily. Discussed Botox for frown complex area.  She has done this in the past with good results. Recommend daily broad spectrum sunscreen SPF 30+ to sun-exposed areas, reapply every 2 hours as needed. Call for new or changing lesions.  Staying in the shade or wearing long sleeves, sun glasses (UVA+UVB protection) and wide brim hats (4-inch brim around the entire circumference of the hat) are also recommended for sun protection.   Return in about 1 year (around 03/23/2024) for TBSE.  Maylene Roes, CMA, am acting as scribe for Armida Sans, MD .  Documentation: I have reviewed the above documentation for accuracy and completeness, and I agree with the above.  Armida Sans, MD

## 2023-03-24 NOTE — Patient Instructions (Signed)

## 2023-03-28 ENCOUNTER — Encounter: Payer: Self-pay | Admitting: Dermatology

## 2023-04-21 ENCOUNTER — Telehealth: Payer: Self-pay

## 2023-04-21 NOTE — Patient Outreach (Signed)
  Care Coordination   04/21/2023 Name: Caitlyn Gregory MRN: [id number] DOB: [birth year]   Care Coordination Outreach Attempts:  Successful contact made with patient.  Patient states she is interested in hearing about services but unable to take call due to guest arriving. Request call back next week.   Follow Up Plan:  Additional outreach attempts will be made to offer the patient care coordination information and services.   Encounter Outcome:  Pt. Request to Call Back   Care Coordination Interventions:  No, not indicated    George Ina [facility] Care Coordination [phone] direct line

## 2023-04-25 ENCOUNTER — Telehealth: Payer: Self-pay

## 2023-04-26 NOTE — Patient Outreach (Signed)
  Care Coordination   Initial Visit Note   04/26/2023 Late entry 04/25/23 Name: Mridula Pizzi Fake MRN: [id number] DOB: 11/28/1955  Latravia H Alkema is a 67 y.o. year old female who sees Sherlene Shams, MD for primary care. I spoke with  Aleyah H Kallman by phone today.  What matters to the patients health and wellness today?  Patient denies having any nursing and/ or community resource needs.     Goals Addressed             This Visit's Progress    Care coordination activities - no further activities       Interventions Today    Flowsheet Row Most Recent Value  General Interventions   General Interventions Discussed/Reviewed General Interventions Discussed  [Care coordination services discussed. SDOH survey completed. AWV discussed and patient advised to contact provider office to schedule. Discussed vaccines. Advised to contact PCP office if care coordination services needed in the future.]                  SDOH assessments and interventions completed:  Yes  SDOH Interventions Today    Flowsheet Row Most Recent Value  SDOH Interventions   Food Insecurity Interventions Intervention Not Indicated  Housing Interventions Intervention Not Indicated  Transportation Interventions Intervention Not Indicated        Care Coordination Interventions:  Yes, provided   Follow up plan: No further intervention required.   Encounter Outcome:  Pt. Visit Completed   George Ina RN,BSN,CCM [facility] Care Coordination [phone] direct line

## 2023-05-17 DIAGNOSIS — H2513 Age-related nuclear cataract, bilateral: Secondary | ICD-10-CM | POA: Diagnosis not present

## 2023-11-02 ENCOUNTER — Other Ambulatory Visit: Payer: Self-pay | Admitting: Internal Medicine

## 2023-12-06 ENCOUNTER — Ambulatory Visit (INDEPENDENT_AMBULATORY_CARE_PROVIDER_SITE_OTHER): Payer: Medicare HMO | Admitting: Internal Medicine

## 2023-12-06 ENCOUNTER — Encounter: Payer: Self-pay | Admitting: Internal Medicine

## 2023-12-06 VITALS — BP 122/78 | HR 88 | Ht 62.0 in | Wt 123.2 lb

## 2023-12-06 DIAGNOSIS — R5383 Other fatigue: Secondary | ICD-10-CM

## 2023-12-06 DIAGNOSIS — E034 Atrophy of thyroid (acquired): Secondary | ICD-10-CM | POA: Diagnosis not present

## 2023-12-06 DIAGNOSIS — R7301 Impaired fasting glucose: Secondary | ICD-10-CM

## 2023-12-06 DIAGNOSIS — Z1231 Encounter for screening mammogram for malignant neoplasm of breast: Secondary | ICD-10-CM | POA: Diagnosis not present

## 2023-12-06 DIAGNOSIS — E785 Hyperlipidemia, unspecified: Secondary | ICD-10-CM | POA: Diagnosis not present

## 2023-12-06 DIAGNOSIS — Z0000 Encounter for general adult medical examination without abnormal findings: Secondary | ICD-10-CM

## 2023-12-06 DIAGNOSIS — J301 Allergic rhinitis due to pollen: Secondary | ICD-10-CM | POA: Diagnosis not present

## 2023-12-06 DIAGNOSIS — N952 Postmenopausal atrophic vaginitis: Secondary | ICD-10-CM

## 2023-12-06 NOTE — Assessment & Plan Note (Signed)
Thyroid function is WNL on current dose.  No current changes needed.   Lab Results  Component Value Date   TSH 1.13 12/01/2022

## 2023-12-06 NOTE — Progress Notes (Signed)
 Patient ID: DOYLE TEGETHOFF, female    DOB: 1956/04/07  Age: 68 y.o. MRN: [id number]  The patient is here for annual preventive examination and management of other chronic and acute problems.   The risk factors are reflected in the social history.   The roster of all physicians providing medical care to patient - is listed in the Snapshot section of the chart.   Activities of daily living:  The patient is 100% independent in all ADLs: dressing, toileting, feeding as well as independent mobility   Home safety : The patient has smoke detectors in the home. They wear seatbelts.  There are no unsecured firearms at home. There is no violence in the home.    There is no risks for hepatitis, STDs or HIV. There is no   history of blood transfusion. They have no travel history to infectious disease endemic areas of the world.   The patient has seen their dentist in the last six month. They have seen their eye doctor in the last year. The patinet  denies slight hearing difficulty with regard to whispered voices and some television programs.  They have deferred audiologic testing in the last year.  They do not  have excessive sun exposure. Discussed the need for sun protection: hats, long sleeves and use of sunscreen if there is significant sun exposure.    Diet: the importance of a healthy diet is discussed. They do have a healthy diet.   The benefits of regular aerobic exercise were discussed. The patient  exercises  3 to 5 days per week  for  60 minutes.    Depression screen: there are no signs or vegative symptoms of depression- irritability, change in appetite, anhedonia, sadness/tearfullness.   The following portions of the patient's history were reviewed and updated as appropriate: allergies, current medications, past family history, past medical history,  past surgical history, past social history  and problem list.   Visual acuity was not assessed per patient preference since the patient has regular  follow up with an  ophthalmologist. Hearing and body mass index were assessed and reviewed.    During the course of the visit the patient was educated and counseled about appropriate screening and preventive services including : fall prevention , diabetes screening, nutrition counseling, colorectal cancer screening, and recommended immunizations.    Chief Complaint:   She has been enjoying her 4 grandchildren. Spends several hours daily with them,  exhausted afterward  .  [place] [employer] expecting her 3rd   in July,  and is doing well in her career     Concerned about Rosalia Hammers 's new onset diabetes.     Psoriasis of elbows and nape  of neck ,  using gold bond medicated cream  on elbow and prn instellar cream on scalp  Not using estrace cream due to concern for safety.  No symptoms currently needing adressing  She notes  bloating and  abd pain with certain foods due to food intolerance  (peanuts and dairy )   Review of Symptoms  Patient denies headache, fevers, malaise, unintentional weight loss, skin rash, eye pain, sinus congestion and sinus pain, sore throat, dysphagia,  hemoptysis , cough, dyspnea, wheezing, chest pain, palpitations, orthopnea, edema, abdominal pain, nausea, melena, diarrhea, constipation, flank pain, dysuria, hematuria, urinary  Frequency, nocturia, numbness, tingling, seizures,  Focal weakness, Loss of consciousness,  Tremor, insomnia, depression, anxiety, and suicidal ideation.    Physical Exam:  BP 122/78   Pulse 88   Ht 5'  2" (1.575 m)   Wt 123 lb 3.2 oz (55.9 kg)   SpO2 97%   BMI 22.53 kg/m    Physical Exam Vitals reviewed.  Constitutional:      General: She is not in acute distress.    Appearance: Normal appearance. She is well-developed and normal weight. She is not ill-appearing, toxic-appearing or diaphoretic.  HENT:     Head: Normocephalic.     Right Ear: Tympanic membrane, ear canal and external ear normal. There is no impacted cerumen.     Left Ear:  Tympanic membrane, ear canal and external ear normal. There is no impacted cerumen.     Nose: Nose normal.     Mouth/Throat:     Mouth: Mucous membranes are moist.     Pharynx: Oropharynx is clear.  Eyes:     General: No scleral icterus.       Right eye: No discharge.        Left eye: No discharge.     Conjunctiva/sclera: Conjunctivae normal.     Pupils: Pupils are equal, round, and reactive to light.  Neck:     Thyroid: No thyromegaly.     Vascular: No carotid bruit or JVD.  Cardiovascular:     Rate and Rhythm: Normal rate and regular rhythm.     Heart sounds: Normal heart sounds.  Pulmonary:     Effort: Pulmonary effort is normal. No respiratory distress.     Breath sounds: Normal breath sounds.  Chest:  Breasts:    Breasts are symmetrical.     Right: Normal. No swelling, inverted nipple, mass, nipple discharge, skin change or tenderness.     Left: Normal. No swelling, inverted nipple, mass, nipple discharge, skin change or tenderness.  Abdominal:     General: Bowel sounds are normal.     Palpations: Abdomen is soft. There is no mass.     Tenderness: There is no abdominal tenderness. There is no guarding or rebound.  Musculoskeletal:        General: Normal range of motion.     Cervical back: Normal range of motion and neck supple.  Lymphadenopathy:     Cervical: No cervical adenopathy.     Upper Body:     Right upper body: No supraclavicular, axillary or pectoral adenopathy.     Left upper body: No supraclavicular, axillary or pectoral adenopathy.  Skin:    General: Skin is warm and dry.  Neurological:     General: No focal deficit present.     Mental Status: She is alert and oriented to person, place, and time. Mental status is at baseline.  Psychiatric:        Mood and Affect: Mood normal.        Behavior: Behavior normal.        Thought Content: Thought content normal.        Judgment: Judgment normal.    Assessment and Plan: Encounter for screening mammogram  for malignant neoplasm of breast -     3D Screening Mammogram, Left and Right; Future  Hypothyroidism due to acquired atrophy of thyroid Assessment & Plan: Thyroid function is WNL on current dose.  No current changes needed.   Lab Results  Component Value Date   TSH 1.13 12/01/2022     Orders: -     TSH  Fatigue, unspecified type -     CBC with Differential/Platelet  Hyperlipidemia, unspecified hyperlipidemia type -     Lipid panel -     LDL cholesterol,  direct  Impaired fasting glucose -     Comprehensive metabolic panel with GFR -     Hemoglobin A1c  Visit for preventive health examination Assessment & Plan: age appropriate education and counseling updated, referrals for preventative services and immunizations addressed, dietary and smoking counseling addressed, most recent labs reviewed.  I have personally reviewed and have noted:   1) the patient's medical and social history 2) The pt's use of alcohol, tobacco, and illicit drugs 3) The patient's current medications and supplements 4) Functional ability including ADL's, fall risk, home safety risk, hearing and visual impairment 5) Diet and physical activities 6) Evidence for depression or mood disorder 7) The patient's height, weight, and BMI have been recorded in the chart   I have made referrals, and provided counseling and education based on review of the above    Seasonal allergic rhinitis due to pollen Assessment & Plan: Continue claritin daily.  Did not tolerate singulair due to personality changes.   avoidiing steroid nasals sprays due to perforated septum .     Postmenopausal atrophic vaginitis Assessment & Plan: She has deferred use of estrace cream     No follow-ups on file.  Sherlene Shams, MD

## 2023-12-06 NOTE — Assessment & Plan Note (Signed)

## 2023-12-06 NOTE — Patient Instructions (Addendum)
 Your weight is excellent!  You are doing everything  right!!  MODERATION IN ALL THINGS, INCLUDING DIET AND   EXERCISE !    For your non headache pain:   Remembr that tylenol is safer than advil.  You can take up to 3000 mg of acetominophen (tylenol) every day safely  In divided doses (500 mg every 6 hours  Or 1000 mg every 12 hours.)   Your annual mammogram has been ordered .  Caitlyn Gregory will not allow [place] to schedule it for you,  so please  call to make your appointment [phone]

## 2023-12-06 NOTE — Assessment & Plan Note (Signed)
 She has deferred use of estrace cream

## 2023-12-06 NOTE — Assessment & Plan Note (Signed)
 Continue claritin daily.  Did not tolerate singulair due to personality changes.   avoidiing steroid nasals sprays due to perforated septum .

## 2023-12-07 ENCOUNTER — Encounter: Payer: Self-pay | Admitting: Internal Medicine

## 2023-12-07 LAB — CBC WITH DIFFERENTIAL/PLATELET
Basophils Absolute: 0.1 10*3/uL (ref 0.0–0.1)
Basophils Relative: 1.1 % (ref 0.0–3.0)
Eosinophils Absolute: 0.1 10*3/uL (ref 0.0–0.7)
Eosinophils Relative: 1 % (ref 0.0–5.0)
HCT: 41.5 % (ref 36.0–46.0)
Hemoglobin: 14.1 g/dL (ref 12.0–15.0)
Lymphocytes Relative: 17.9 % (ref 12.0–46.0)
Lymphs Abs: 1 10*3/uL (ref 0.7–4.0)
MCHC: 33.9 g/dL (ref 30.0–36.0)
MCV: 99.7 fl (ref 78.0–100.0)
Monocytes Absolute: 0.4 10*3/uL (ref 0.1–1.0)
Monocytes Relative: 8 % (ref 3.0–12.0)
Neutro Abs: 3.9 10*3/uL (ref 1.4–7.7)
Neutrophils Relative %: 72 % (ref 43.0–77.0)
Platelets: 225 10*3/uL (ref 150.0–400.0)
RBC: 4.16 Mil/uL (ref 3.87–5.11)
RDW: 12.4 % (ref 11.5–15.5)
WBC: 5.4 10*3/uL (ref 4.0–10.5)

## 2023-12-07 LAB — COMPREHENSIVE METABOLIC PANEL WITH GFR
ALT: 12 U/L (ref 0–35)
AST: 18 U/L (ref 0–37)
Albumin: 4.7 g/dL (ref 3.5–5.2)
Alkaline Phosphatase: 52 U/L (ref 39–117)
BUN: 15 mg/dL (ref 6–23)
CO2: 29 meq/L (ref 19–32)
Calcium: 9.2 mg/dL (ref 8.4–10.5)
Chloride: 101 meq/L (ref 96–112)
Creatinine, Ser: 0.8 mg/dL (ref 0.40–1.20)
GFR: 76.42 mL/min (ref >60.00)
Glucose, Bld: 87 mg/dL (ref 70–99)
Potassium: 4 meq/L (ref 3.5–5.1)
Sodium: 136 meq/L (ref 135–145)
Total Bilirubin: 0.6 mg/dL (ref 0.2–1.2)
Total Protein: 7 g/dL (ref 6.0–8.3)

## 2023-12-07 LAB — LIPID PANEL
Cholesterol: 186 mg/dL (ref 0–200)
HDL: 77 mg/dL (ref >39.00)
LDL Cholesterol: 100 mg/dL — ABNORMAL HIGH (ref 0–99)
NonHDL: 109.34
Total CHOL/HDL Ratio: 2
Triglycerides: 49 mg/dL (ref 0.0–149.0)
VLDL: 9.8 mg/dL (ref 0.0–40.0)

## 2023-12-07 LAB — LDL CHOLESTEROL, DIRECT: Direct LDL: 98 mg/dL

## 2023-12-07 LAB — HEMOGLOBIN A1C: Hgb A1c MFr Bld: 5.2 % (ref 4.6–6.5)

## 2023-12-07 LAB — TSH: TSH: 0.71 u[IU]/mL (ref 0.35–5.50)

## 2023-12-08 ENCOUNTER — Encounter: Payer: Self-pay | Admitting: Internal Medicine

## 2023-12-26 ENCOUNTER — Other Ambulatory Visit: Payer: Self-pay | Admitting: Internal Medicine

## 2023-12-26 DIAGNOSIS — G43809 Other migraine, not intractable, without status migrainosus: Secondary | ICD-10-CM

## 2024-01-12 ENCOUNTER — Encounter: Payer: Medicare HMO | Admitting: Internal Medicine

## 2024-01-17 ENCOUNTER — Ambulatory Visit
Admission: RE | Admit: 2024-01-17 | Discharge: 2024-01-17 | Disposition: A | Discharge status: Home / Self Care | Source: Ambulatory Visit | Attending: Internal Medicine | Admitting: Internal Medicine

## 2024-01-17 DIAGNOSIS — Z1231 Encounter for screening mammogram for malignant neoplasm of breast: Secondary | ICD-10-CM | POA: Diagnosis not present

## 2024-01-30 ENCOUNTER — Other Ambulatory Visit: Payer: Self-pay | Admitting: Internal Medicine

## 2024-01-30 DIAGNOSIS — G43809 Other migraine, not intractable, without status migrainosus: Secondary | ICD-10-CM

## 2024-03-14 DIAGNOSIS — J309 Allergic rhinitis, unspecified: Secondary | ICD-10-CM | POA: Diagnosis not present

## 2024-03-14 DIAGNOSIS — J3489 Other specified disorders of nose and nasal sinuses: Secondary | ICD-10-CM | POA: Diagnosis not present

## 2024-04-18 ENCOUNTER — Encounter: Payer: Self-pay | Admitting: Dermatology

## 2024-04-18 ENCOUNTER — Ambulatory Visit: Payer: Medicare HMO | Admitting: Dermatology

## 2024-04-18 DIAGNOSIS — L814 Other melanin hyperpigmentation: Secondary | ICD-10-CM

## 2024-04-18 DIAGNOSIS — L820 Inflamed seborrheic keratosis: Secondary | ICD-10-CM

## 2024-04-18 DIAGNOSIS — L409 Psoriasis, unspecified: Secondary | ICD-10-CM | POA: Diagnosis not present

## 2024-04-18 DIAGNOSIS — L578 Other skin changes due to chronic exposure to nonionizing radiation: Secondary | ICD-10-CM | POA: Diagnosis not present

## 2024-04-18 DIAGNOSIS — Z1283 Encounter for screening for malignant neoplasm of skin: Secondary | ICD-10-CM

## 2024-04-18 DIAGNOSIS — Z79899 Other long term (current) drug therapy: Secondary | ICD-10-CM | POA: Diagnosis not present

## 2024-04-18 DIAGNOSIS — L811 Chloasma: Secondary | ICD-10-CM | POA: Diagnosis not present

## 2024-04-18 DIAGNOSIS — Z7189 Other specified counseling: Secondary | ICD-10-CM

## 2024-04-18 DIAGNOSIS — D225 Melanocytic nevi of trunk: Secondary | ICD-10-CM

## 2024-04-18 DIAGNOSIS — W908XXA Exposure to other nonionizing radiation, initial encounter: Secondary | ICD-10-CM | POA: Diagnosis not present

## 2024-04-18 DIAGNOSIS — D229 Melanocytic nevi, unspecified: Secondary | ICD-10-CM

## 2024-04-18 DIAGNOSIS — D1801 Hemangioma of skin and subcutaneous tissue: Secondary | ICD-10-CM

## 2024-04-18 MED ORDER — ENSTILAR 0.005-0.064 % EX FOAM: CUTANEOUS | 11 refills | Status: AC

## 2024-04-18 NOTE — Progress Notes (Signed)
 Follow-Up Visit   Subjective  Caitlyn Gregory is a 68 y.o. female who presents for the following: Skin Cancer Screening and Full Body Skin Exam. No personal Hx of skin cancer or dysplastic nevi.   The patient presents for Total-Body Skin Exam (TBSE) for skin cancer screening and mole check. The patient has spots, moles and lesions to be evaluated, some may be new or changing and the patient may have concern these could be cancer.  Psoriasis on scalp and elbows. Unable to get Vtama or Zoryve . Uses Gold Bond psoriasis cream and Enstilar  foam.   The following portions of the chart were reviewed this encounter and updated as appropriate: medications, allergies, medical history  Review of Systems:  No other skin or systemic complaints except as noted in HPI or Assessment and Plan.  Objective  Well appearing patient in no apparent distress; mood and affect are within normal limits.  A full examination was performed including scalp, head, eyes, ears, nose, lips, neck, chest, axillae, abdomen, back, buttocks, bilateral upper extremities, bilateral lower extremities, hands, feet, fingers, toes, fingernails, and toenails. All findings within normal limits unless otherwise noted below.   Relevant physical exam findings are noted in the Assessment and Plan.  R forehead x2, L elbow x2, R elbow x1, L calf x2, R lat calf x1 (8) Erythematous keratotic or waxy stuck-on papule or plaque.       Assessment & Plan   SKIN CANCER SCREENING PERFORMED TODAY.  ACTINIC DAMAGE - Chronic condition, secondary to cumulative UV/sun exposure - diffuse scaly erythematous macules with underlying dyspigmentation - Recommend daily broad spectrum sunscreen SPF 30+ to sun-exposed areas, reapply every 2 hours as needed.  - Staying in the shade or wearing long sleeves, sun glasses (UVA+UVB protection) and wide brim hats (4-inch brim around the entire circumference of the hat) are also recommended for sun protection.  -  Call for new or changing lesions.  LENTIGINES, SEBORRHEIC KERATOSES, HEMANGIOMAS - Benign normal skin lesions - Benign-appearing - Call for any changes  MELANOCYTIC NEVI - Tan-brown and/or pink-flesh-colored symmetric macules and papules - 0.5 x 0.3 cm regular brown macule at LUQ abdomen. Present for many years by history without change Photo taken today.  - Benign appearing on exam today - Observation - Call clinic for new or changing moles - Recommend daily use of broad spectrum spf 30+ sunscreen to sun-exposed areas.   PSORIASIS Exam: Scalp clear, pinkness on the elbows, but no scale. 3% BSA. Chronic condition with duration or expected duration over one year. Currently well-controlled. Patient denies joint pain Psoriasis is a chronic non-curable, but treatable genetic/hereditary disease that may have other systemic features affecting other organ systems such as joints (Psoriatic Arthritis). It is associated with an increased risk of inflammatory bowel disease, heart disease, non-alcoholic fatty liver disease, and depression.  Treatments include light and laser treatments; topical medications; and systemic medications including oral and injectables.  Treatment Plan: Continue OTC Gold Bond psoriasis cream.  Continue Enstilar  foam once or twice daily to affected areas as needed for psoriasis.   MELASMA Exam: reticulated hyperpigmented patches at face Chronic and persistent condition with duration or expected duration over one year. Condition is bothersome/symptomatic for patient. Currently flared. Melasma is a chronic; persistent condition of hyperpigmented patches generally on the face, worse in summer due to higher UV exposure.    Heredity; thyroid  disease; sun exposure; pregnancy; birth control pills; epilepsy medication and darker skin may predispose to Melasma.   Recommendations include: -  Sun avoidance and daily broad spectrum (UVA/UVB) tinted mineral sunscreen SPF 30+, with  Zinc or Titanium Dioxide. - Rx topical bleaching creams (i.e. hydroquinone) is a common treatment but should not be used long term.  Hydroquinones may be mixed with retinoids; vitamin C; steroids; Kojic Acid. - Alastin A-luminate, retinoids, vitamin C, topical tranexamic acid, glycolic acid and kojic acid can be used for brightening while on break from hydroquinone - Rx Azelaic Acid is also a treatment option that is safe for pregnancy (Category B). - OTC Heliocare can be helpful in control and prevention. - Oral Rx with Tranexamic Acid 250 mg - 650 mg po daily can be used for moderate to severe cases especially during summer (contraindications include pregnancy; lactation; hx of PE; hx of DVT; clotting disorder; heart disease; anticoagulant use and upcoming long trips)   - Chemical peels (would need multiple for best result).  - Lasers and  Microdermabrasion may also be helpful adjunct treatments. Treatment Plan: Patient deferred treatment at this time.   Vascular birthmark at sacral area. Benign-appearing. Stable compared to previous visit. Observation.  Call clinic for new or changing moles.  Recommend daily use of broad spectrum spf 30+ sunscreen to sun-exposed areas.   INFLAMED SEBORRHEIC KERATOSIS (8) R forehead x2, L elbow x2, R elbow x1, L calf x2, R lat calf x1 (8) Symptomatic, irritating, patient would like treated. Destruction of lesion - R forehead x2, L elbow x2, R elbow x1, L calf x2, R lat calf x1 (8) Complexity: simple   Destruction method: cryotherapy   Informed consent: discussed and consent obtained   Timeout:  patient name, date of birth, surgical site, and procedure verified Lesion destroyed using liquid nitrogen: Yes   Region frozen until ice ball extended beyond lesion: Yes   Outcome: patient tolerated procedure well with no complications   Post-procedure details: wound care instructions given   Additional details:  Prior to procedure, discussed risks of blister  formation, small wound, skin dyspigmentation, or rare scar following cryotherapy. Recommend Vaseline ointment to treated areas while healing.   SKIN CANCER SCREENING   ACTINIC SKIN DAMAGE   PSORIASIS   COUNSELING AND COORDINATION OF CARE   MEDICATION MANAGEMENT   MELASMA   HEMANGIOMA OF SKIN   LENTIGO   MELANOCYTIC NEVUS, UNSPECIFIED LOCATION    Return in about 1 year (around 04/18/2025) for TBSE.  I, Jill Parcell, CMA, am acting as scribe for Alm Rhyme, MD.   Documentation: I have reviewed the above documentation for accuracy and completeness, and I agree with the above.  Alm Rhyme, MD

## 2024-04-18 NOTE — Patient Instructions (Addendum)
 Cryotherapy Aftercare  Wash gently with soap and water everyday.   Apply Vaseline Jelly daily until healed.    Continue OTC Gold Bond psoriasis cream.   Continue Enstilar  foam once or twice daily to affected areas as needed for psoriasis.   Recommend daily broad spectrum sunscreen SPF 30+ to sun-exposed areas, reapply every 2 hours as needed. Call for new or changing lesions.  Staying in the shade or wearing long sleeves, sun glasses (UVA+UVB protection) and wide brim hats (4-inch brim around the entire circumference of the hat) are also recommended for sun protection.       Melanoma ABCDEs  Melanoma is the most dangerous type of skin cancer, and is the leading cause of death from skin disease.  You are more likely to develop melanoma if you: Have light-colored skin, light-colored eyes, or red or blond hair Spend a lot of time in the sun Tan regularly, either outdoors or in a tanning bed Have had blistering sunburns, especially during childhood Have a close family member who has had a melanoma Have atypical moles or large birthmarks  Early detection of melanoma is key since treatment is typically straightforward and cure rates are extremely high if we catch it early.   The first sign of melanoma is often a change in a mole or a new dark spot.  The ABCDE system is a way of remembering the signs of melanoma.  A for asymmetry:  The two halves do not match. B for border:  The edges of the growth are irregular. C for color:  A mixture of colors are present instead of an even brown color. D for diameter:  Melanomas are usually (but not always) greater than 6mm - the size of a pencil eraser. E for evolution:  The spot keeps changing in size, shape, and color.  Please check your skin once per month between visits. You can use a small mirror in front and a large mirror behind you to keep an eye on the back side or your body.   If you see any new or changing lesions before your next  follow-up, please call to schedule a visit.  Please continue daily skin protection including broad spectrum sunscreen SPF 30+ to sun-exposed areas, reapplying every 2 hours as needed when you're outdoors.   Staying in the shade or wearing long sleeves, sun glasses (UVA+UVB protection) and wide brim hats (4-inch brim around the entire circumference of the hat) are also recommended for sun protection.       Due to recent changes in healthcare laws, you may see results of your pathology and/or laboratory studies on MyChart before the doctors have had a chance to review them. We understand that in some cases there may be results that are confusing or concerning to you. Please understand that not all results are received at the same time and often the doctors may need to interpret multiple results in order to provide you with the best plan of care or course of treatment. Therefore, we ask that you please give us  2 business days to thoroughly review all your results before contacting the office for clarification. Should we see a critical lab result, you will be contacted sooner.   If You Need Anything After Your Visit  If you have any questions or concerns for your doctor, please call our main line at [phone] and press option 4 to reach your doctor's medical assistant. If no one answers, please leave a voicemail as directed and we will  return your call as soon as possible. Messages left after 4 pm will be answered the following business day.   You may also send us  a message via MyChart. We typically respond to MyChart messages within 1-2 business days.  For prescription refills, please ask your pharmacy to contact our office. Our fax number is [phone].  If you have an urgent issue when the clinic is closed that cannot wait until the next business day, you can page your doctor at the number below.    Please note that while we do our best to be available for urgent issues outside of office  hours, we are not available 24/7.   If you have an urgent issue and are unable to reach us , you may choose to seek medical care at your doctor's office, retail clinic, urgent care center, or emergency room.  If you have a medical emergency, please immediately call 911 or go to the emergency department.  Pager Numbers  - Dr. Hester: [phone]  - Dr. Jackquline: [phone]  - Dr. Claudene: [phone]   - Dr. Raymund: [phone]  In the event of inclement weather, please call our main line at [phone] for an update on the status of any delays or closures.  Dermatology Medication Tips: Please keep the boxes that topical medications come in in order to help keep track of the instructions about where and how to use these. Pharmacies typically print the medication instructions only on the boxes and not directly on the medication tubes.   If your medication is too expensive, please contact our office at [phone] option 4 or send us  a message through MyChart.   We are unable to tell what your co-pay for medications will be in advance as this is different depending on your insurance coverage. However, we may be able to find a substitute medication at lower cost or fill out paperwork to get insurance to cover a needed medication.   If a prior authorization is required to get your medication covered by your insurance company, please allow us  1-2 business days to complete this process.  Drug prices often vary depending on where the prescription is filled and some pharmacies may offer cheaper prices.  The website www.goodrx.com contains coupons for medications through different pharmacies. The prices here do not account for what the cost may be with help from insurance (it may be cheaper with your insurance), but the website can give you the price if you did not use any insurance.  - You can print the associated coupon and take it with your prescription to the pharmacy.  - You may also  stop by our office during regular business hours and pick up a GoodRx coupon card.  - If you need your prescription sent electronically to a different pharmacy, notify our office through [facility] or by phone at [phone] option 4.     Si Usted Necesita Algo Despus de Su Visita  Tambin puede enviarnos un mensaje a travs de Clinical cytogeneticist. Por lo general respondemos a los mensajes de MyChart en el transcurso de 1 a 2 das hbiles.  Para renovar recetas, por favor pida a su farmacia que se ponga en contacto con nuestra oficina. Randi lakes de fax es [place] [phone].  Si tiene un asunto urgente cuando la clnica est cerrada y que no puede esperar hasta el siguiente da hbil, puede llamar/localizar a su doctor(a) al nmero que aparece a continuacin.   Por favor, tenga en cuenta que aunque hacemos todo lo  posible para estar disponibles para asuntos urgentes fuera del horario de oficina, no estamos disponibles las 24 horas del da, los 7 [address]  [address] de la [place].   Si tiene un problema urgente y no puede comunicarse con nosotros, puede optar por buscar atencin mdica  en el consultorio de su doctor(a), en una clnica privada, en un centro de atencin urgente o en una sala de emergencias.  Si tiene Engineer, drilling, por favor llame inmediatamente al 911 o vaya a la sala de emergencias.  Nmeros de bper  - Dr. Hester: [phone]  - Dra. Jackquline: [phone]  - Dr. Claudene: [phone]  - Dra. Kitts: [phone]  En caso de inclemencias del [place], por favor llame a nuestra lnea principal al [phone] para una actualizacin sobre el estado de cualquier retraso o cierre.  Consejos para la medicacin en dermatologa: Por favor, guarde las cajas en las que vienen los medicamentos de uso tpico para ayudarle a seguir las instrucciones sobre dnde y cmo usarlos. Las farmacias generalmente imprimen las instrucciones del medicamento slo en las cajas y no directamente en los  tubos del [place].   Si su medicamento es muy caro, por favor, pngase en contacto con landry rieger llamando al [phone] y presione la opcin 4 o envenos un mensaje a travs de Clinical cytogeneticist.   No podemos decirle cul ser su copago por los medicamentos por adelantado ya que esto es diferente dependiendo de la cobertura de su seguro. Sin embargo, es posible que podamos encontrar un medicamento sustituto a Audiological scientist un formulario para que el seguro cubra el medicamento que se considera necesario.   Si se requiere una autorizacin previa para que su compaa de seguros malta su medicamento, por favor permtanos de 1 a 2 das hbiles para completar este proceso.  Los precios de los medicamentos varan con frecuencia dependiendo del Environmental consultant de dnde se surte la receta y alguna farmacias pueden ofrecer precios ms baratos.  El sitio web www.goodrx.com tiene cupones para medicamentos de Health and safety inspector. Los precios aqu no tienen en cuenta lo que podra costar con la ayuda del seguro (puede ser ms barato con su seguro), pero el sitio web puede darle el precio si no utiliz Tourist information centre manager.  - Puede imprimir el cupn correspondiente y llevarlo con su receta a la farmacia.  - Tambin puede pasar por nuestra oficina durante el horario de atencin regular y Education officer, museum una tarjeta de cupones de GoodRx.  - Si necesita que su receta se enve electrnicamente a una farmacia diferente, informe a nuestra oficina a travs de MyChart de  o por telfono llamando al [phone] y presione la opcin 4.

## 2024-04-26 ENCOUNTER — Other Ambulatory Visit: Payer: Self-pay | Admitting: Internal Medicine

## 2024-04-26 DIAGNOSIS — G43809 Other migraine, not intractable, without status migrainosus: Secondary | ICD-10-CM

## 2024-05-22 DIAGNOSIS — H2513 Age-related nuclear cataract, bilateral: Secondary | ICD-10-CM | POA: Diagnosis not present

## 2024-05-22 DIAGNOSIS — H16223 Keratoconjunctivitis sicca, not specified as Sjogren's, bilateral: Secondary | ICD-10-CM | POA: Diagnosis not present

## 2024-07-17 NOTE — Telephone Encounter (Signed)
 open in error

## 2024-08-28 ENCOUNTER — Other Ambulatory Visit: Payer: Self-pay | Admitting: Internal Medicine

## 2024-08-28 DIAGNOSIS — G43809 Other migraine, not intractable, without status migrainosus: Secondary | ICD-10-CM

## 2024-09-24 ENCOUNTER — Other Ambulatory Visit: Payer: Self-pay | Admitting: Internal Medicine

## 2024-09-24 DIAGNOSIS — G43809 Other migraine, not intractable, without status migrainosus: Secondary | ICD-10-CM

## 2024-12-06 ENCOUNTER — Encounter: Admitting: Internal Medicine

## 2025-04-18 ENCOUNTER — Ambulatory Visit: Admitting: Dermatology

## 2025-04-23 ENCOUNTER — Ambulatory Visit: Admitting: Dermatology

## 2025-05-14 ENCOUNTER — Ambulatory Visit: Admitting: Dermatology
